# Patient Record
Sex: Male | Born: 1955
Health system: Southern US, Community
[De-identification: ages and names within clinical notes are randomized; demographics above are authoritative.]

## PROBLEM LIST (undated history)

## (undated) DIAGNOSIS — G4730 Sleep apnea, unspecified: Secondary | ICD-10-CM

## (undated) DIAGNOSIS — I10 Essential (primary) hypertension: Secondary | ICD-10-CM

## (undated) DIAGNOSIS — H269 Unspecified cataract: Secondary | ICD-10-CM

## (undated) DIAGNOSIS — E785 Hyperlipidemia, unspecified: Secondary | ICD-10-CM

## (undated) HISTORY — DX: Sleep apnea, unspecified: G47.30

## (undated) HISTORY — DX: Hyperlipidemia, unspecified: E78.5

## (undated) HISTORY — PX: BACK SURGERY: SHX140

## (undated) HISTORY — DX: Unspecified cataract: H26.9

---

## 1986-08-09 HISTORY — PX: BACK SURGERY: SHX140

## 2009-01-26 ENCOUNTER — Ambulatory Visit: Payer: Self-pay | Admitting: Diagnostic Radiology

## 2009-01-26 ENCOUNTER — Emergency Department (HOSPITAL_BASED_OUTPATIENT_CLINIC_OR_DEPARTMENT_OTHER): Admission: EM | Admit: 2009-01-26 | Discharge: 2009-01-26 | Payer: Self-pay | Admitting: Emergency Medicine

## 2011-09-29 ENCOUNTER — Emergency Department (INDEPENDENT_AMBULATORY_CARE_PROVIDER_SITE_OTHER): Payer: BC Managed Care – PPO

## 2011-09-29 ENCOUNTER — Encounter (HOSPITAL_BASED_OUTPATIENT_CLINIC_OR_DEPARTMENT_OTHER): Payer: Self-pay | Admitting: *Deleted

## 2011-09-29 ENCOUNTER — Emergency Department (HOSPITAL_BASED_OUTPATIENT_CLINIC_OR_DEPARTMENT_OTHER)
Admission: EM | Admit: 2011-09-29 | Discharge: 2011-09-29 | Disposition: A | Discharge status: Home / Self Care | Payer: BC Managed Care – PPO | Attending: Emergency Medicine | Admitting: Emergency Medicine

## 2011-09-29 DIAGNOSIS — I10 Essential (primary) hypertension: Secondary | ICD-10-CM | POA: Insufficient documentation

## 2011-09-29 DIAGNOSIS — R109 Unspecified abdominal pain: Secondary | ICD-10-CM | POA: Insufficient documentation

## 2011-09-29 DIAGNOSIS — S92919A Unspecified fracture of unspecified toe(s), initial encounter for closed fracture: Secondary | ICD-10-CM

## 2011-09-29 DIAGNOSIS — M533 Sacrococcygeal disorders, not elsewhere classified: Secondary | ICD-10-CM

## 2011-09-29 DIAGNOSIS — W19XXXA Unspecified fall, initial encounter: Secondary | ICD-10-CM

## 2011-09-29 DIAGNOSIS — E119 Type 2 diabetes mellitus without complications: Secondary | ICD-10-CM | POA: Insufficient documentation

## 2011-09-29 DIAGNOSIS — M25579 Pain in unspecified ankle and joints of unspecified foot: Secondary | ICD-10-CM | POA: Insufficient documentation

## 2011-09-29 DIAGNOSIS — M79609 Pain in unspecified limb: Secondary | ICD-10-CM | POA: Insufficient documentation

## 2011-09-29 DIAGNOSIS — Z79899 Other long term (current) drug therapy: Secondary | ICD-10-CM | POA: Insufficient documentation

## 2011-09-29 DIAGNOSIS — S92902A Unspecified fracture of left foot, initial encounter for closed fracture: Secondary | ICD-10-CM

## 2011-09-29 DIAGNOSIS — IMO0002 Reserved for concepts with insufficient information to code with codable children: Secondary | ICD-10-CM | POA: Insufficient documentation

## 2011-09-29 DIAGNOSIS — W108XXA Fall (on) (from) other stairs and steps, initial encounter: Secondary | ICD-10-CM | POA: Insufficient documentation

## 2011-09-29 HISTORY — DX: Essential (primary) hypertension: I10

## 2011-09-29 MED ORDER — BACITRACIN 500 UNIT/GM EX OINT
1.0000 "application " | TOPICAL_OINTMENT | Freq: Two times a day (BID) | CUTANEOUS | Status: DC
  Filled 2011-09-29: qty 0.9

## 2011-09-29 MED ORDER — OXYCODONE-ACETAMINOPHEN 5-325 MG PO TABS: 1.0000 | ORAL_TABLET | Freq: Four times a day (QID) | ORAL | Status: AC | PRN

## 2011-09-29 MED ORDER — TETANUS-DIPHTH-ACELL PERTUSSIS 5-2.5-18.5 LF-MCG/0.5 IM SUSP
0.5000 mL | Freq: Once | INTRAMUSCULAR | Status: AC
  Administered 2011-09-29: 0.5 mL via INTRAMUSCULAR
  Filled 2011-09-29: qty 0.5

## 2011-09-29 NOTE — Discharge Instructions (Signed)
Foot Fracture     A fractured foot is a broken bone in your foot. These fractures are usually caused by twisting or crush injuries. Some foot fractures are stress fractures which are due to excess walking or exercise. If the bones are in a good position, foot fractures will usually heal in about 6 weeks. You should keep your foot elevated for the next 2 to 4 days and apply ice packs to the area of the injury for 20 to 30 minutes every 2 to 3 hours until the swelling and pain get better.  If you have been placed in a cast or splint, keep it on until you have been checked by your caregiver. Do not walk on a broken foot until bearing weight is relatively painless. Often a cast or podiatric shoe with a stiff sole is used to allow early walking. Repeat X-rays are often needed in 3 to 6 weeks to make sure the fracture is healing.  Follow up with your caregiver as recommended.   SEEK IMMEDIATE MEDICAL CARE IF:  · You have increased pain, or your toes become cold, numb, or pale.   MAKE SURE YOU:   · Understand these instructions.   · Will watch your condition.   · Will get help right away if you are not doing well or get worse.   Document Released: 09/02/2004 Document Revised: 04/07/2011 Document Reviewed: 08/28/2008  [employer]® Patient Information ©2012 [employer].

## 2011-09-29 NOTE — ED Provider Notes (Signed)
History     CSN: [id number]  Arrival date & time 09/29/11  0046   First MD Initiated Contact with Patient 09/29/11 0114      Chief Complaint  Patient presents with  . Foot Pain    (Consider location/radiation/quality/duration/timing/severity/associated sxs/prior treatment) Patient is a 56 y.o. male presenting with lower extremity pain and fall.  Foot Pain This is a new problem. The current episode started 1 to 2 hours ago. The problem occurs constantly. The problem has not changed since onset.Pertinent negatives include no chest pain, no abdominal pain, no headaches and no shortness of breath. The symptoms are aggravated by walking. The symptoms are relieved by narcotics. Treatments tried: narcotics. The treatment provided mild relief.  Fall The accident occurred 1 to 2 hours ago. The fall occurred while walking. Distance fallen: down several steps. He landed on a hard floor. The volume of blood lost was minimal. Point of impact: tail bone and left foot. Pain location: foot and tailbone. The pain is at a severity of 9/10. The pain is severe. He was ambulatory at the scene. There was no entrapment after the fall. There was no alcohol use involved in the accident. Pertinent negatives include no abdominal pain, no headaches, no hearing loss, no loss of consciousness and no tingling. The symptoms are aggravated by activity. Prehospitalization: none. Treatments tried: narcotic pain medication. The treatment provided mild relief.    Past Medical History  Diagnosis Date  . Diabetes mellitus   . Hypertension     Past Surgical History  Procedure Date  . Back surgery     No family history on file.  History  Substance Use Topics  . Smoking status: Never Smoker   . Smokeless tobacco: Not on file  . Alcohol Use:       Review of Systems  Constitutional: Negative.   HENT: Negative.   Eyes: Negative.   Respiratory: Negative for shortness of breath.   Cardiovascular: Negative for  chest pain.  Gastrointestinal: Negative.  Negative for abdominal pain.  Genitourinary: Negative.   Musculoskeletal: Negative.   Skin: Positive for wound.  Neurological: Negative for tingling, loss of consciousness and headaches.  Hematological: Negative.   Psychiatric/Behavioral: Negative.     Allergies  Review of patient's allergies indicates no known allergies.  Home Medications   Current Outpatient Rx  Name Route Sig Dispense Refill  . GLIMEPIRIDE 1 MG PO TABS Oral Take 1 mg by mouth daily before breakfast.    . LISINOPRIL 10 MG PO TABS Oral Take 10 mg by mouth daily.    Marland Kitchen METFORMIN HCL 1000 MG PO TABS Oral Take 1,000 mg by mouth 2 (two) times daily with a meal.      BP 138/65  Pulse 79  Temp(Src) 97.6 F (36.4 C) (Oral)  Resp 20  SpO2 96%  Physical Exam  Constitutional: He is oriented to person, place, and time. He appears well-developed and well-nourished.  HENT:  Head: Normocephalic and atraumatic.  Right Ear: No hemotympanum.  Left Ear: No hemotympanum.  Mouth/Throat: Oropharynx is clear and moist.  Eyes: EOM are normal. Pupils are equal, round, and reactive to light.  Neck: Normal range of motion. Neck supple.  Cardiovascular: Normal rate and regular rhythm.   Pulmonary/Chest: Effort normal and breath sounds normal.  Abdominal: Soft. Bowel sounds are normal. There is no tenderness. There is no rebound and no guarding.  Musculoskeletal: Normal range of motion.       Left foot: He exhibits tenderness.  Feet:       Intact achilles, neurovascularly intact left foot.  2+ dorsalis pedis  Sensation intact motor intact.    Neurological: He is alert and oriented to person, place, and time.  Skin: Skin is warm and dry.  Psychiatric: He has a normal mood and affect.    ED Course  Procedures (including critical care time)  Labs Reviewed - No data to display Dg Pelvis 1-2 Views  09/29/2011  *RADIOLOGY REPORT*  Clinical Data: Status post fall; pelvic pain,  radiating to the coccyx.  PELVIS - 1-2 VIEW  Comparison: None.  Findings: There is no evidence of fracture or dislocation.  Both femoral heads are seated normally within their respective acetabula.  No significant degenerative change is appreciated.  The sacroiliac joints are unremarkable in appearance.  The visualized bowel gas pattern is grossly unremarkable in appearance.  IMPRESSION: No evidence of fracture or dislocation.  Original Report Authenticated By: Tonia Ghent, M.D.   Dg Ankle Complete Left  09/29/2011  *RADIOLOGY REPORT*  Clinical Data: Status post fall; left foot pain.  LEFT ANKLE COMPLETE - 3+ VIEW  Comparison: None.  Findings: There is no evidence of fracture or dislocation.  The ankle mortise is intact; the interosseous space is within normal limits.  No talar tilt or subluxation is seen.  Plantar and posterior calcaneal spurs are incidentally noted.  The joint spaces are preserved.  No significant soft tissue abnormalities are seen.  IMPRESSION: No evidence of fracture or dislocation; known fractures of the fourth and fifth proximal phalanges are not seen on ankle radiograph.  Original Report Authenticated By: Tonia Ghent, M.D.   Dg Foot Complete Left  09/29/2011  *RADIOLOGY REPORT*  Clinical Data: Status post fall; left foot pain, at the fifth metatarsal, radiating to the lateral malleolus.  LEFT FOOT - COMPLETE 3+ VIEW  Comparison: None.  Findings: There is a mildly displaced mildly comminuted fracture involving the base of the fifth proximal phalanx, with intra- articular extension both medially and laterally. There also appears to be a mildly displaced fracture involving the base of the fourth proximal phalanx, without definite evidence of intra-articular extension.  The joint spaces are otherwise preserved.  There is no evidence of talar subluxation; the subtalar joint is unremarkable in appearance.  A posterior calcaneal spur is incidentally noted.  Soft tissue swelling is noted  along the lateral aspect of the foot.  IMPRESSION:  1.  Mildly displaced mildly comminuted fracture involving the base of the fifth proximal phalanx, with intra-articular extension. 2.  Apparent mildly displaced fracture involving the base of the fourth proximal phalanx.  Original Report Authenticated By: Tonia Ghent, M.D.     No diagnosis found.    MDM  Case d/w Dr. Despina Hick, call to be seen today        Jaylissa Felty K Savreen Gebhardt-Rasch, MD 09/29/11 [phone]

## 2011-09-29 NOTE — ED Notes (Addendum)
Patient fell down the stairs appx an hour ago. His left foot got caught in the rails and he landed onto his tailbone.  Patient took vicodin and phenergan at home before coming to the ER

## 2012-01-10 ENCOUNTER — Encounter (HOSPITAL_BASED_OUTPATIENT_CLINIC_OR_DEPARTMENT_OTHER): Payer: Self-pay | Admitting: Family Medicine

## 2012-01-10 ENCOUNTER — Emergency Department (HOSPITAL_BASED_OUTPATIENT_CLINIC_OR_DEPARTMENT_OTHER)
Admission: EM | Admit: 2012-01-10 | Discharge: 2012-01-10 | Disposition: A | Discharge status: Home / Self Care | Payer: BC Managed Care – PPO | Attending: Emergency Medicine | Admitting: Emergency Medicine

## 2012-01-10 DIAGNOSIS — K648 Other hemorrhoids: Secondary | ICD-10-CM | POA: Insufficient documentation

## 2012-01-10 DIAGNOSIS — K649 Unspecified hemorrhoids: Secondary | ICD-10-CM

## 2012-01-10 DIAGNOSIS — E119 Type 2 diabetes mellitus without complications: Secondary | ICD-10-CM | POA: Insufficient documentation

## 2012-01-10 DIAGNOSIS — K644 Residual hemorrhoidal skin tags: Secondary | ICD-10-CM | POA: Insufficient documentation

## 2012-01-10 DIAGNOSIS — K921 Melena: Secondary | ICD-10-CM | POA: Insufficient documentation

## 2012-01-10 DIAGNOSIS — I10 Essential (primary) hypertension: Secondary | ICD-10-CM | POA: Insufficient documentation

## 2012-01-10 DIAGNOSIS — R197 Diarrhea, unspecified: Secondary | ICD-10-CM

## 2012-01-10 DIAGNOSIS — R739 Hyperglycemia, unspecified: Secondary | ICD-10-CM

## 2012-01-10 LAB — BASIC METABOLIC PANEL
BUN: 14 mg/dL (ref 6–23)
Chloride: 98 mEq/L (ref 96–112)
GFR calc Af Amer: >90 mL/min (ref >90)
GFR calc non Af Amer: >90 mL/min (ref >90)
Potassium: 3.8 mEq/L (ref 3.5–5.1)
Sodium: 135 mEq/L (ref 135–145)

## 2012-01-10 LAB — HEMOGLOBIN AND HEMATOCRIT, BLOOD: Hemoglobin: 15.1 g/dL (ref 13.0–17.0)

## 2012-01-10 LAB — OCCULT BLOOD X 1 CARD TO LAB, STOOL: Fecal Occult Bld: POSITIVE

## 2012-01-10 NOTE — ED Provider Notes (Signed)
History     CSN: [id number]  Arrival date & time 01/10/12  1103   First MD Initiated Contact with Patient 01/10/12 1144      Chief Complaint  Patient presents with  . Diarrhea    (Consider location/radiation/quality/duration/timing/severity/associated sxs/prior treatment) HPI  23yoM h/o DM, HTN presents with diarrhea and rectal bleeding. Patient states that he began to have diarrhea 3 days ago. He reports approximately 3 loose stools per day. He states that the diarrhea is getting better but this morning he noticed mucousy bloody discharge surrounding his stool which was more well formed today. He noticed no blood in the toilet or when he wiped. He describes it as a minimal amount. He denies abdominal pain, nausea, vomiting. He denies rectal pain. No history of similar. No known history of hemorrhoids. He states that he has never had a colonoscopy. No history of anticoagulant use. No recent abx use. Denies recent travel.   Past Medical History  Diagnosis Date  . Diabetes mellitus   . Hypertension     Past Surgical History  Procedure Date  . Back surgery     No family history on file.  History  Substance Use Topics  . Smoking status: Never Smoker   . Smokeless tobacco: Not on file  . Alcohol Use: No      Review of Systems  All other systems reviewed and are negative.   except as noted HPI   Allergies  Review of patient's allergies indicates no known allergies.  Home Medications   Current Outpatient Rx  Name Route Sig Dispense Refill  . VITAMIN D 1000 UNITS PO TABS Oral Take by mouth daily.    Marland Kitchen GLIMEPIRIDE 1 MG PO TABS Oral Take 1 mg by mouth daily before breakfast.    . LISINOPRIL 10 MG PO TABS Oral Take 10 mg by mouth daily.    Marland Kitchen METFORMIN HCL 1000 MG PO TABS Oral Take 1,000 mg by mouth 2 (two) times daily with a meal.      BP 150/92  Pulse 89  Temp(Src) 98.6 F (37 C) (Oral)  Resp 20  Ht 6' (1.829 m)  Wt 308 lb (139.708 kg)  BMI 41.77 kg/m2  SpO2  96%  Physical Exam  Nursing note and vitals reviewed. Constitutional: He is oriented to person, place, and time. He appears well-developed and well-nourished. No distress.  HENT:  Head: Atraumatic.  Mouth/Throat: Oropharynx is clear and moist.  Eyes: Conjunctivae are normal. Pupils are equal, round, and reactive to light.  Neck: Neck supple.  Cardiovascular: Normal rate, regular rhythm, normal heart sounds and intact distal pulses.  Exam reveals no gallop and no friction rub.   No murmur heard. Pulmonary/Chest: Effort normal. No respiratory distress. He has no wheezes. He has no rales.  Abdominal: Soft. Bowel sounds are normal. There is no tenderness. There is no rebound and no guarding.  Genitourinary:       +int/ext hemorrhoids Brown mucous stool with red flecks of blood Heme POS No rectal pain with digital exam  Musculoskeletal: Normal range of motion. He exhibits no edema and no tenderness.  Neurological: He is alert and oriented to person, place, and time.  Skin: Skin is warm and dry.  Psychiatric: He has a normal mood and affect.    ED Course  Procedures (including critical care time)  Labs Reviewed  BASIC METABOLIC PANEL - Abnormal; Notable for the following:    Glucose, Bld 220 (*)    All other components within normal  limits  HEMOGLOBIN AND HEMATOCRIT, BLOOD  OCCULT BLOOD X 1 CARD TO LAB, STOOL   No results found.   1. Diarrhea   2. Blood in stool   3. Hemorrhoid   4. Hypertension   5. Hyperglycemia     MDM  Well appearing, presents with resolving diarrhea and BRB surrounding BM today. He is not having pain. He is afebrile without recent travel. No recent antibiotic use. He has brown mucousy stools with flecks of red and hemorrhoids present on exam. I do not suspect significant GI bleed at this time. His hemoglobin is unremarkable. He's been given precautions for return to the emergency department including rectal pain, continued bright red blood per, abdominal  pain. He's been advised to obtain an appointment with his primary care Dr. for several reasons including one colonoscopy referral to recheck of his blood pressure which was elevated today 3 followup for long-term care of his diabetes. He states that he has been compliant with his medications he has refills for both his antihypertensives and diabetic medications at home. Patient feels comfortable with discharge. No EMC precluding discharge at this time. Given Precautions for return. PMD f/u.        Forbes Cellar, MD 01/10/12 [phone]

## 2012-01-10 NOTE — Discharge Instructions (Signed)
Bloody Stools Bloody stools often mean that there is a problem in the digestive tract. Your caregiver may use the term "melena" to describe black, tarry, and bad smelling stools or "hematochezia" to describe red or maroon-colored stools. Blood seen in the stool can be caused by bleeding anywhere along the intestinal tract.  A black stool usually means that blood is coming from the upper part of the gastrointestinal tract (esophagus, stomach, or small bowel). Passing maroon-colored stools or bright red blood usually means that blood is coming from lower down in the large bowel or the rectum. However, sometimes massive bleeding in the stomach or small intestine can cause bright red bloody stools.  Consuming black licorice, lead, iron pills, medicines containing bismuth subsalicylate, or blueberries can also cause black stools. Your caregiver can test black stools to see if blood is present. It is important that the cause of the bleeding be found. Treatment can then be started, and the problem can be corrected. Rectal bleeding may not be serious, but you should not assume everything is okay until you know the cause.It is very important to follow up with your caregiver or a specialist in gastrointestinal problems. CAUSES  Blood in the stools can come from various underlying causes.Often, the cause is not found during your first visit. Testing is often needed to discover the cause of bleeding in the gastrointestinal tract. Causes range from simple to serious or even life-threatening.Possible causes include:  Hemorrhoids.These are veins that are full of blood (engorged) in the rectum. They cause pain, inflammation, and may bleed.   Anal fissures.These are areas of painful tearing which may bleed. They are often caused by passing hard stool.   Diverticulosis.These are pouches that form on the colon over time, with age, and may bleed significantly.   Diverticulitis.This is inflammation in areas with  diverticulosis. It can cause pain, fever, and bloody stools, although bleeding is rare.   Proctitis and colitis. These are inflamed areas of the rectum or colon. They may cause pain, fever, and bloody stools.   Polyps and cancer. Colon cancer is a leading cause of preventable cancer death.It often starts out as precancerous polyps that can be removed during a colonoscopy, preventing progression into cancer. Sometimes, polyps and cancer may cause rectal bleeding.   Gastritis and ulcers.Bleeding from the upper gastrointestinal tract (near the stomach) may travel through the intestines and produce black, sometimes tarry, often bad smelling stools. In certain cases, if the bleeding is fast enough, the stools may not be black, but red and the condition may be life-threatening.  SYMPTOMS  You may have stools that are bright red and bloody, that are normal color with blood on them, or that are dark black and tarry. In some cases, you may only have blood in the toilet bowl. Any of these cases need medical care. You may also have:  Pain at the anus or anywhere in the rectum.   Lightheadedness or feeling faint.   Extreme weakness.   Nausea or vomiting.   Fever.  DIAGNOSIS Your caregiver may use the following methods to find the cause of your bleeding:  Taking a medical history. Age is important. Older people tend to develop polyps and cancer more often. If there is anal pain and a hard, large stool associated with bleeding, a tear of the anus may be the cause. If blood drips into the toilet after a bowel movement, bleeding hemorrhoids may be the problem. The color and frequency of the bleeding are additional considerations.   In most cases, the medical history provides clues, but seldom the final answer.   A visual and finger (digital) exam. Your caregiver will inspect the anal area, looking for tears and hemorrhoids. A finger exam can provide information when there is tenderness or a growth inside. In  men, the prostate is also examined.   Endoscopy. Several types of small, long scopes (endoscopes) are used to view the colon.   In the office, your caregiver may use a rigid, or more commonly, a flexible viewing sigmoidoscope. This exam is called flexible sigmoidoscopy. It is performed in 5 to 10 minutes.   A more thorough exam is accomplished with a colonoscope. It allows your caregiver to view the entire 5 to 6 foot long colon. Medicine to help you relax (sedative) is usually given for this exam. Frequently, a bleeding lesion may be present beyond the reach of the sigmoidoscope. So, a colonoscopy may be the best exam to start with. Both exams are usually done on an outpatient basis. This means the patient does not stay overnight in the hospital or surgery center.   An upper endoscopy may be needed to examine your stomach. Sedation is used and a flexible endoscope is put in your mouth, down to your stomach.   A barium enema X-ray. This is an X-ray exam. It uses liquid barium inserted by enema into the rectum. This test alone may not identify an actual bleeding point. X-rays highlight abnormal shadows, such as those made by lumps (tumors), diverticuli, or colitis.  TREATMENT  Treatment depends on the cause of your bleeding.   For bleeding from the stomach or colon, the caregiver doing your endoscopy or colonoscopy may be able to stop the bleeding as part of the procedure.   Inflammation or infection of the colon can be treated with medicines.   Many rectal problems can be treated with creams, suppositories, or warm baths.   Surgery is sometimes needed.   Blood transfusions are sometimes needed if you have lost a lot of blood.   For any bleeding problem, let your caregiver know if you take aspirin or other blood thinners regularly.  HOME CARE INSTRUCTIONS   Take any medicines exactly as prescribed.   Keep your stools soft by eating a diet high in fiber. Prunes (1 to 3 a day) work well for  many people.   Drink enough water and fluids to keep your urine clear or pale yellow.   Take sitz baths if advised. A sitz bath is when you sit in a bathtub with warm water for 10 to 15 minutes to soak, soothe, and cleanse the rectal area.   If enemas or suppositories are advised, be sure you know how to use them. Tell your caregiver if you have problems with this.   Monitor your bowel movements to look for signs of improvement or worsening.  SEEK MEDICAL CARE IF:   You do not improve in the time expected.   Your condition worsens after initial improvement.   You develop any new symptoms.  SEEK IMMEDIATE MEDICAL CARE IF:   You develop severe or prolonged rectal bleeding.   You vomit blood.   You feel weak or faint.   You have a fever.  MAKE SURE YOU:  Understand these instructions.   Will watch your condition.   Will get help right away if you are not doing well or get worse.  Document Released: 07/16/2002 Document Revised: 07/15/2011 Document Reviewed: 12/11/2010 [facility] Patient Information 2012 [place], [place]. RESOURCE  GUIDE  Dental Problems  Patients with Medicaid: [facility] [id number]                                           [phone] [employer] [address] Phone:  [phone]                                                   Phone:  [phone]  If unable to pay or uninsured, contact:  [facility] or [facility]. to become qualified for the adult dental clinic.  Chronic Pain Problems Contact [facility]  [phone] Patients need to be referred by their primary care doctor.  Insufficient Money for Medicine Contact [employer]:  call "211" or [facility] [phone].  No Primary Care Doctor Call [facility]  [phone] Other agencies that provide inexpensive medical care    [facility]  [phone]    [facility]  [phone]    [facility]  [phone]    [facility]  [phone]    [facility]  [phone]    [facility]  [phone]  Psychological Services [facility]  [phone] [facility]  [phone] [facility]   [phone] (emergency services [phone])  Abuse/Neglect [facility] Child Abuse Hotline 205-378-2792 [facility] Child Abuse Hotline (717)452-5181 (After Hours)  Emergency Shelter [facility] [phone]  M[facility] [place] [facility] 639-093-5151 [facility] [phone]  MRSA Hotline #:   551-835-2526    [facility] Resources  [facility]  [employer]                           [facility] Dept. [address] [place]                     [address]         [address]  Blondell Reveal Phone:  [phone]                                  Phone:  [phone]                   Phone:  [phone]  [facility] Phone:  [phone]  [facility] Child Abuse Hotline [phone] 4344416078 (After Hours)

## 2012-01-10 NOTE — ED Notes (Signed)
Pt c/o diarrhea x 3 days and "noticed bright red blood streaks in toilet this morning". Pt denies fever, n/v.

## 2013-09-15 ENCOUNTER — Emergency Department (HOSPITAL_BASED_OUTPATIENT_CLINIC_OR_DEPARTMENT_OTHER): Payer: BC Managed Care – PPO

## 2013-09-15 ENCOUNTER — Emergency Department (HOSPITAL_BASED_OUTPATIENT_CLINIC_OR_DEPARTMENT_OTHER)
Admission: EM | Admit: 2013-09-15 | Discharge: 2013-09-15 | Disposition: A | Discharge status: Home / Self Care | Payer: BC Managed Care – PPO | Attending: Emergency Medicine | Admitting: Emergency Medicine

## 2013-09-15 ENCOUNTER — Encounter (HOSPITAL_BASED_OUTPATIENT_CLINIC_OR_DEPARTMENT_OTHER): Payer: Self-pay | Admitting: Emergency Medicine

## 2013-09-15 DIAGNOSIS — Z79899 Other long term (current) drug therapy: Secondary | ICD-10-CM | POA: Insufficient documentation

## 2013-09-15 DIAGNOSIS — E119 Type 2 diabetes mellitus without complications: Secondary | ICD-10-CM | POA: Insufficient documentation

## 2013-09-15 DIAGNOSIS — I10 Essential (primary) hypertension: Secondary | ICD-10-CM | POA: Insufficient documentation

## 2013-09-15 DIAGNOSIS — Z9889 Other specified postprocedural states: Secondary | ICD-10-CM | POA: Insufficient documentation

## 2013-09-15 DIAGNOSIS — M25511 Pain in right shoulder: Secondary | ICD-10-CM

## 2013-09-15 DIAGNOSIS — M25519 Pain in unspecified shoulder: Secondary | ICD-10-CM | POA: Insufficient documentation

## 2013-09-15 MED ORDER — KETOROLAC TROMETHAMINE 60 MG/2ML IM SOLN
60.0000 mg | Freq: Once | INTRAMUSCULAR | Status: AC
  Administered 2013-09-15: 60 mg via INTRAMUSCULAR
  Filled 2013-09-15: qty 2

## 2013-09-15 MED ORDER — OXYCODONE-ACETAMINOPHEN 5-325 MG PO TABS
2.0000 | ORAL_TABLET | Freq: Once | ORAL | Status: AC
  Administered 2013-09-15: 2 via ORAL
  Filled 2013-09-15: qty 2

## 2013-09-15 MED ORDER — CYCLOBENZAPRINE HCL 10 MG PO TABS
10.0000 mg | ORAL_TABLET | Freq: Once | ORAL | Status: AC
  Administered 2013-09-15: 10 mg via ORAL
  Filled 2013-09-15: qty 1

## 2013-09-15 MED ORDER — CYCLOBENZAPRINE HCL 10 MG PO TABS: 10.0000 mg | ORAL_TABLET | Freq: Two times a day (BID) | ORAL | Status: DC | PRN

## 2013-09-15 MED ORDER — OXYCODONE-ACETAMINOPHEN 5-325 MG PO TABS: 2.0000 | ORAL_TABLET | ORAL | Status: DC | PRN

## 2013-09-15 NOTE — ED Provider Notes (Signed)
CSN: [id number]     Arrival date & time 09/15/13  1426 History   First MD Initiated Contact with Patient 09/15/13 1449     Chief Complaint  Patient presents with  . Shoulder Pain   (Consider location/radiation/quality/duration/timing/severity/associated sxs/prior Treatment) HPI Comments: Patient is a 58 year old male who presents with right shoulder pain that started 1 week ago. Symptom started gradually and progressively worsened since the onset. The pain is aching and severe with radiation down his right arm. Patient denies any injury but does reports recently starting and exercise regimen. He reports doing bicep curls and thinks that may have something to do with the pain. Position of the shoulder does not affect the pain. No aggravating/alleviating factors. No other symptoms.   Patient is a 58 y.o. male presenting with shoulder pain.  Shoulder Pain Associated symptoms include arthralgias. Pertinent negatives include no abdominal pain, chest pain, chills, fatigue, fever, nausea, neck pain, vomiting or weakness.    Past Medical History  Diagnosis Date  . Diabetes mellitus   . Hypertension    Past Surgical History  Procedure Laterality Date  . Back surgery     No family history on file. History  Substance Use Topics  . Smoking status: Never Smoker   . Smokeless tobacco: Not on file  . Alcohol Use: No    Review of Systems  Constitutional: Negative for fever, chills and fatigue.  HENT: Negative for trouble swallowing.   Eyes: Negative for visual disturbance.  Respiratory: Negative for shortness of breath.   Cardiovascular: Negative for chest pain and palpitations.  Gastrointestinal: Negative for nausea, vomiting, abdominal pain and diarrhea.  Genitourinary: Negative for dysuria and difficulty urinating.  Musculoskeletal: Positive for arthralgias. Negative for neck pain.  Skin: Negative for color change.  Neurological: Negative for dizziness and weakness.   Psychiatric/Behavioral: Negative for dysphoric mood.    Allergies  Review of patient's allergies indicates no known allergies.  Home Medications   Current Outpatient Rx  Name  Route  Sig  Dispense  Refill  . cholecalciferol (VITAMIN D) 1000 UNITS tablet   Oral   Take by mouth daily.         Marland Kitchen. glimepiride (AMARYL) 1 MG tablet   Oral   Take 1 mg by mouth daily before breakfast.         . lisinopril (PRINIVIL,ZESTRIL) 10 MG tablet   Oral   Take 10 mg by mouth daily.         . metFORMIN (GLUCOPHAGE) 1000 MG tablet   Oral   Take 1,000 mg by mouth 2 (two) times daily with a meal.          BP 128/75  Pulse 81  Temp(Src) 98.5 F (36.9 C)  Ht 6' (1.829 m)  Wt 290 lb (131.543 kg)  BMI 39.32 kg/m2  SpO2 97% Physical Exam  Nursing note and vitals reviewed. Constitutional: He is oriented to person, place, and time. He appears well-developed and well-nourished. No distress.  HENT:  Head: Normocephalic and atraumatic.  Eyes: Conjunctivae and EOM are normal. Pupils are equal, round, and reactive to light.  Neck: Normal range of motion.  Cardiovascular: Normal rate and regular rhythm.  Exam reveals no gallop and no friction rub.   No murmur heard. Pulmonary/Chest: Effort normal and breath sounds normal. He has no wheezes. He has no rales. He exhibits no tenderness.  Abdominal: Soft.  Musculoskeletal: Normal range of motion.  Full ROM of right shoulder. No tenderness to palpation or obvious  deformity.   Neurological: He is alert and oriented to person, place, and time. Coordination normal.  Upper extremity strength and sensation equal and intact bilaterally. Speech is goal-oriented. Moves limbs without ataxia.   Skin: Skin is warm and dry.  Psychiatric: He has a normal mood and affect. His behavior is normal.    ED Course  Procedures (including critical care time) Labs Review Labs Reviewed - No data to display Imaging Review Dg Shoulder Right  09/15/2013   CLINICAL  DATA:  Right shoulder pain and swelling.  EXAM: RIGHT SHOULDER - 2+ VIEW  COMPARISON:  None.  FINDINGS: The right shoulder is located. The visualized right ribs are intact. Negative for an acute fracture. Sclerotic density in the scapular body is nonspecific.  IMPRESSION: No acute bone abnormality to the right shoulder.  Sclerotic density or lesion in the right scapula is nonspecific. Findings could be associated with a bone island.   Electronically Signed   By: Richarda Overlie M.D.   On: 09/15/2013 15:12   Ct Chest Wo Contrast  09/15/2013   CLINICAL DATA:  Right shoulder pain and swelling. Sclerotic density or lesion in the right scapula on right shoulder radiographs earlier today.  EXAM: CT CHEST WITHOUT CONTRAST  TECHNIQUE: Multidetector CT imaging of the chest was performed following the standard protocol without IV contrast.  COMPARISON:  Right shoulder radiographs obtained earlier today.  FINDINGS: Coarse calcifications in the right lobe of the thyroid gland and 8 mm left lobe thyroid nodule.  Coronary artery atheromatous calcifications. Bilateral calcified granulomata. The largest is in the right lower lobe at the level of the right inferior pulmonary vein, measuring 1.2 cm on image number 37. Normal sized calcified right hilar lymph nodes.  No noncalcified lung nodules or enlarged lymph nodes. Again demonstrated is a sclerotic focus in the lateral aspect of the right scapular body. This measures 1.7 x 1.4 cm on image 18. This measures 1.8 cm in length on coronal image number 69. No other sclerotic bone lesions. Thoracic spine degenerative changes.  Partially included oval, heterogeneous low density mass in the mid to upper left kidney, posteriorly, measuring 2.4 x 2.3 cm on image number 64.  IMPRESSION: 1. 1.8 cm rounded sclerotic focus in the scapular body on the right. This has CT features most compatible with a bone island. 2. Partially included low density mass in the upper left kidney, measuring 2.4 cm in  maximum diameter. This does not have the typical noncontrasted appearance of a cyst. This could be initially evaluated with renal ultrasound. This may ultimately require further evaluation with pre and post contrast CT of the abdomen and pelvis. 3. Previous granulomatous infection. 4. Coarse right lobe thyroid calcifications and 8 mm left thyroid nodule, too small to characterize, but most likely benign in the absence of known clinical risk factors for thyroid carcinoma.   Electronically Signed   By: Gordan Payment M.D.   On: 09/15/2013 16:24    EKG Interpretation   None       MDM   1. Right shoulder pain     4:44 PM Patient's shoulder xray shows sclerotic density that is non specific. I spoke with Dr. Lowella Dandy, the radiologist, and he recommended follow up with CT of the chest to visualize the scapula. This imaged showed the lesion to be a bone island. Patient received toradol for pain which provided some relief. Patient will be discharged with Percocet and flexeril for pain. Patient advised to follow up with Ortho. Patient refused  sling due to uncomfortable position.     Emilia Beck, PA-C 09/15/13 1657

## 2013-09-15 NOTE — ED Notes (Signed)
Dc home with rx x 2 given for percocet and flexeril- family present to drive

## 2013-09-15 NOTE — Discharge Instructions (Signed)
Take Percocet as needed for pain. Take Flexeril as needed for muscle spasm. You may take these medications together. Follow up with Dr. Rennis ChrisSupple for further evaluation of your pain. Refer to attached documents for more information.

## 2013-09-15 NOTE — ED Provider Notes (Signed)
Medical screening examination/treatment/procedure(s) were performed by non-physician practitioner and as supervising physician I was immediately available for consultation/collaboration.  EKG Interpretation   None         Junius ArgyleForrest S Curran Lenderman, MD 09/15/13 [phone]

## 2013-09-15 NOTE — ED Notes (Signed)
Used some weights at the gym several weeks ago to do some curls.  Developed right shoulder pain/swelling several days ago.  Painful ROM.  Denies numbness/tingling.  Pain radiates from shoulder into his arm.

## 2013-10-05 ENCOUNTER — Ambulatory Visit: Payer: BC Managed Care – PPO | Attending: Sports Medicine | Admitting: Physical Therapy

## 2013-10-05 DIAGNOSIS — R29898 Other symptoms and signs involving the musculoskeletal system: Secondary | ICD-10-CM | POA: Insufficient documentation

## 2013-10-05 DIAGNOSIS — M5020 Other cervical disc displacement, unspecified cervical region: Secondary | ICD-10-CM | POA: Insufficient documentation

## 2013-10-05 DIAGNOSIS — M436 Torticollis: Secondary | ICD-10-CM | POA: Insufficient documentation

## 2013-10-05 DIAGNOSIS — M542 Cervicalgia: Secondary | ICD-10-CM | POA: Insufficient documentation

## 2013-10-05 DIAGNOSIS — IMO0001 Reserved for inherently not codable concepts without codable children: Secondary | ICD-10-CM | POA: Insufficient documentation

## 2013-10-09 ENCOUNTER — Ambulatory Visit: Payer: BC Managed Care – PPO | Attending: Sports Medicine | Admitting: Physical Therapy

## 2013-10-09 DIAGNOSIS — M542 Cervicalgia: Secondary | ICD-10-CM | POA: Insufficient documentation

## 2013-10-09 DIAGNOSIS — M436 Torticollis: Secondary | ICD-10-CM | POA: Insufficient documentation

## 2013-10-09 DIAGNOSIS — R29898 Other symptoms and signs involving the musculoskeletal system: Secondary | ICD-10-CM | POA: Insufficient documentation

## 2013-10-09 DIAGNOSIS — M5020 Other cervical disc displacement, unspecified cervical region: Secondary | ICD-10-CM | POA: Insufficient documentation

## 2013-10-09 DIAGNOSIS — IMO0001 Reserved for inherently not codable concepts without codable children: Secondary | ICD-10-CM | POA: Insufficient documentation

## 2013-10-11 ENCOUNTER — Ambulatory Visit: Payer: BC Managed Care – PPO | Admitting: Physical Therapy

## 2013-10-16 ENCOUNTER — Ambulatory Visit: Payer: BC Managed Care – PPO | Admitting: Physical Therapy

## 2013-10-18 ENCOUNTER — Ambulatory Visit: Payer: BC Managed Care – PPO | Admitting: Physical Therapy

## 2013-10-23 ENCOUNTER — Ambulatory Visit: Payer: BC Managed Care – PPO | Admitting: Physical Therapy

## 2013-10-25 ENCOUNTER — Ambulatory Visit: Payer: BC Managed Care – PPO | Admitting: Physical Therapy

## 2013-10-30 ENCOUNTER — Ambulatory Visit: Payer: BC Managed Care – PPO | Admitting: Physical Therapy

## 2013-11-01 ENCOUNTER — Ambulatory Visit: Payer: BC Managed Care – PPO | Admitting: Physical Therapy

## 2013-11-07 ENCOUNTER — Ambulatory Visit: Payer: BC Managed Care – PPO | Admitting: Physical Therapy

## 2013-11-20 ENCOUNTER — Ambulatory Visit: Payer: BC Managed Care – PPO | Attending: Sports Medicine | Admitting: Physical Therapy

## 2013-11-20 DIAGNOSIS — M436 Torticollis: Secondary | ICD-10-CM | POA: Insufficient documentation

## 2013-11-20 DIAGNOSIS — M542 Cervicalgia: Secondary | ICD-10-CM | POA: Insufficient documentation

## 2013-11-20 DIAGNOSIS — IMO0001 Reserved for inherently not codable concepts without codable children: Secondary | ICD-10-CM | POA: Insufficient documentation

## 2013-11-20 DIAGNOSIS — R29898 Other symptoms and signs involving the musculoskeletal system: Secondary | ICD-10-CM | POA: Insufficient documentation

## 2013-11-20 DIAGNOSIS — M5020 Other cervical disc displacement, unspecified cervical region: Secondary | ICD-10-CM | POA: Insufficient documentation

## 2014-02-27 ENCOUNTER — Ambulatory Visit: Payer: BC Managed Care – PPO | Attending: Sports Medicine | Admitting: Physical Therapy

## 2014-02-27 DIAGNOSIS — IMO0002 Reserved for concepts with insufficient information to code with codable children: Secondary | ICD-10-CM | POA: Insufficient documentation

## 2014-02-27 DIAGNOSIS — I10 Essential (primary) hypertension: Secondary | ICD-10-CM | POA: Insufficient documentation

## 2014-02-27 DIAGNOSIS — R293 Abnormal posture: Secondary | ICD-10-CM | POA: Insufficient documentation

## 2014-02-27 DIAGNOSIS — IMO0001 Reserved for inherently not codable concepts without codable children: Secondary | ICD-10-CM | POA: Diagnosis present

## 2014-02-27 DIAGNOSIS — M5412 Radiculopathy, cervical region: Secondary | ICD-10-CM | POA: Insufficient documentation

## 2014-02-27 DIAGNOSIS — E119 Type 2 diabetes mellitus without complications: Secondary | ICD-10-CM | POA: Insufficient documentation

## 2014-02-27 DIAGNOSIS — M542 Cervicalgia: Secondary | ICD-10-CM | POA: Insufficient documentation

## 2014-02-27 DIAGNOSIS — M79609 Pain in unspecified limb: Secondary | ICD-10-CM | POA: Diagnosis not present

## 2014-02-27 DIAGNOSIS — G4730 Sleep apnea, unspecified: Secondary | ICD-10-CM | POA: Insufficient documentation

## 2014-03-05 ENCOUNTER — Ambulatory Visit: Payer: BC Managed Care – PPO | Admitting: Physical Therapy

## 2014-03-05 DIAGNOSIS — IMO0001 Reserved for inherently not codable concepts without codable children: Secondary | ICD-10-CM | POA: Diagnosis not present

## 2014-03-13 ENCOUNTER — Ambulatory Visit: Payer: BC Managed Care – PPO | Admitting: Physical Therapy

## 2014-03-19 ENCOUNTER — Ambulatory Visit: Payer: BC Managed Care – PPO | Attending: Sports Medicine | Admitting: Physical Therapy

## 2014-03-19 DIAGNOSIS — R293 Abnormal posture: Secondary | ICD-10-CM | POA: Insufficient documentation

## 2014-03-19 DIAGNOSIS — M5412 Radiculopathy, cervical region: Secondary | ICD-10-CM | POA: Insufficient documentation

## 2014-03-19 DIAGNOSIS — I10 Essential (primary) hypertension: Secondary | ICD-10-CM | POA: Insufficient documentation

## 2014-03-19 DIAGNOSIS — IMO0001 Reserved for inherently not codable concepts without codable children: Secondary | ICD-10-CM | POA: Insufficient documentation

## 2014-03-19 DIAGNOSIS — G4730 Sleep apnea, unspecified: Secondary | ICD-10-CM | POA: Insufficient documentation

## 2014-03-19 DIAGNOSIS — M542 Cervicalgia: Secondary | ICD-10-CM | POA: Insufficient documentation

## 2014-03-19 DIAGNOSIS — IMO0002 Reserved for concepts with insufficient information to code with codable children: Secondary | ICD-10-CM | POA: Insufficient documentation

## 2014-03-19 DIAGNOSIS — E119 Type 2 diabetes mellitus without complications: Secondary | ICD-10-CM | POA: Insufficient documentation

## 2014-03-19 DIAGNOSIS — M79609 Pain in unspecified limb: Secondary | ICD-10-CM | POA: Insufficient documentation

## 2014-03-21 ENCOUNTER — Ambulatory Visit: Payer: BC Managed Care – PPO | Admitting: Physical Therapy

## 2014-03-25 ENCOUNTER — Ambulatory Visit: Payer: BC Managed Care – PPO | Admitting: Physical Therapy

## 2014-03-28 ENCOUNTER — Ambulatory Visit: Payer: BC Managed Care – PPO | Admitting: Physical Therapy

## 2014-04-01 ENCOUNTER — Ambulatory Visit: Payer: BC Managed Care – PPO | Admitting: Physical Therapy

## 2014-04-01 DIAGNOSIS — M79609 Pain in unspecified limb: Secondary | ICD-10-CM | POA: Diagnosis not present

## 2014-04-01 DIAGNOSIS — IMO0001 Reserved for inherently not codable concepts without codable children: Secondary | ICD-10-CM | POA: Diagnosis not present

## 2014-04-01 DIAGNOSIS — I10 Essential (primary) hypertension: Secondary | ICD-10-CM | POA: Diagnosis not present

## 2014-04-01 DIAGNOSIS — M5412 Radiculopathy, cervical region: Secondary | ICD-10-CM | POA: Diagnosis not present

## 2014-04-01 DIAGNOSIS — IMO0002 Reserved for concepts with insufficient information to code with codable children: Secondary | ICD-10-CM | POA: Diagnosis not present

## 2014-04-01 DIAGNOSIS — M542 Cervicalgia: Secondary | ICD-10-CM | POA: Diagnosis not present

## 2014-04-01 DIAGNOSIS — G4730 Sleep apnea, unspecified: Secondary | ICD-10-CM | POA: Diagnosis not present

## 2014-04-01 DIAGNOSIS — E119 Type 2 diabetes mellitus without complications: Secondary | ICD-10-CM | POA: Diagnosis not present

## 2014-04-01 DIAGNOSIS — R293 Abnormal posture: Secondary | ICD-10-CM | POA: Diagnosis not present

## 2014-04-04 ENCOUNTER — Ambulatory Visit: Payer: BC Managed Care – PPO | Admitting: Physical Therapy

## 2014-04-09 ENCOUNTER — Ambulatory Visit: Payer: BC Managed Care – PPO | Admitting: Physical Therapy

## 2018-06-16 ENCOUNTER — Other Ambulatory Visit: Payer: Self-pay | Admitting: Orthopedic Surgery

## 2018-06-16 DIAGNOSIS — M25511 Pain in right shoulder: Secondary | ICD-10-CM

## 2018-06-30 ENCOUNTER — Ambulatory Visit
Admission: RE | Admit: 2018-06-30 | Discharge: 2018-06-30 | Disposition: A | Discharge status: Home / Self Care | Payer: BLUE CROSS/BLUE SHIELD | Source: Ambulatory Visit | Attending: Orthopedic Surgery | Admitting: Orthopedic Surgery

## 2018-06-30 DIAGNOSIS — M25511 Pain in right shoulder: Secondary | ICD-10-CM

## 2018-10-10 DIAGNOSIS — E782 Mixed hyperlipidemia: Secondary | ICD-10-CM | POA: Diagnosis not present

## 2018-10-10 DIAGNOSIS — Z7984 Long term (current) use of oral hypoglycemic drugs: Secondary | ICD-10-CM | POA: Diagnosis not present

## 2018-10-10 DIAGNOSIS — I10 Essential (primary) hypertension: Secondary | ICD-10-CM | POA: Diagnosis not present

## 2018-10-10 DIAGNOSIS — E1165 Type 2 diabetes mellitus with hyperglycemia: Secondary | ICD-10-CM | POA: Diagnosis not present

## 2018-11-23 DIAGNOSIS — M75111 Incomplete rotator cuff tear or rupture of right shoulder, not specified as traumatic: Secondary | ICD-10-CM | POA: Diagnosis not present

## 2018-11-23 DIAGNOSIS — M7501 Adhesive capsulitis of right shoulder: Secondary | ICD-10-CM | POA: Diagnosis not present

## 2018-11-23 DIAGNOSIS — M7531 Calcific tendinitis of right shoulder: Secondary | ICD-10-CM | POA: Diagnosis not present

## 2018-11-23 DIAGNOSIS — M7541 Impingement syndrome of right shoulder: Secondary | ICD-10-CM | POA: Diagnosis not present

## 2019-01-10 ENCOUNTER — Emergency Department (HOSPITAL_BASED_OUTPATIENT_CLINIC_OR_DEPARTMENT_OTHER)
Admission: EM | Admit: 2019-01-10 | Discharge: 2019-01-10 | Disposition: A | Discharge status: Home / Self Care | Payer: BC Managed Care – PPO | Attending: Emergency Medicine | Admitting: Emergency Medicine

## 2019-01-10 ENCOUNTER — Other Ambulatory Visit: Payer: Self-pay

## 2019-01-10 ENCOUNTER — Encounter (HOSPITAL_BASED_OUTPATIENT_CLINIC_OR_DEPARTMENT_OTHER): Payer: Self-pay

## 2019-01-10 DIAGNOSIS — M5431 Sciatica, right side: Secondary | ICD-10-CM | POA: Insufficient documentation

## 2019-01-10 DIAGNOSIS — E119 Type 2 diabetes mellitus without complications: Secondary | ICD-10-CM | POA: Insufficient documentation

## 2019-01-10 DIAGNOSIS — Z79899 Other long term (current) drug therapy: Secondary | ICD-10-CM | POA: Insufficient documentation

## 2019-01-10 DIAGNOSIS — I10 Essential (primary) hypertension: Secondary | ICD-10-CM | POA: Insufficient documentation

## 2019-01-10 DIAGNOSIS — M545 Low back pain: Secondary | ICD-10-CM | POA: Diagnosis not present

## 2019-01-10 DIAGNOSIS — Z7984 Long term (current) use of oral hypoglycemic drugs: Secondary | ICD-10-CM | POA: Insufficient documentation

## 2019-01-10 MED ORDER — OXYCODONE-ACETAMINOPHEN 5-325 MG PO TABS
2.0000 | ORAL_TABLET | Freq: Once | ORAL | Status: AC
  Administered 2019-01-10: 2 via ORAL
  Filled 2019-01-10: qty 2

## 2019-01-10 MED ORDER — PREDNISONE 20 MG PO TABS: 60.0000 mg | ORAL_TABLET | Freq: Every day | ORAL | 0 refills | Status: DC

## 2019-01-10 MED ORDER — KETOROLAC TROMETHAMINE 30 MG/ML IJ SOLN
30.0000 mg | Freq: Once | INTRAMUSCULAR | Status: AC
  Administered 2019-01-10: 30 mg via INTRAMUSCULAR
  Filled 2019-01-10: qty 1

## 2019-01-10 MED ORDER — OXYCODONE-ACETAMINOPHEN 5-325 MG PO TABS: 2.0000 | ORAL_TABLET | Freq: Four times a day (QID) | ORAL | 0 refills | Status: DC | PRN

## 2019-01-10 NOTE — Discharge Instructions (Addendum)
Take prednisone as prescribed until completed.  After you have finished prednisone, I recommend taking ibuprofen as prescribed over-the-counter.  Do not take prednisone and ibuprofen (or other NSAID medication) together.  Take Percocet every 6 hours as needed for severe pain.  Continue taking your Robaxin as prescribed, as needed for muscle pain or spasms.  Do not drive or operate machinery while taking Percocet or Robaxin.  Use ice and heat alternating 20 minutes on, 20 minutes off.  Please follow-up with your primary care provider or orthopedic doctor for further evaluation and treatment of ongoing symptoms.  Please return to the emergency department if you develop any new or worsening symptoms including complete numbness of your legs, groin area, or loss of bowel or bladder control.  Do not drink alcohol, drive, operate machinery or participate in any other potentially dangerous activities while taking opiate pain medication as it may make you sleepy. Do not take this medication with any other sedating medications, either prescription or over-the-counter. If you were prescribed Percocet or Vicodin, do not take these with acetaminophen (Tylenol) as it is already contained within these medications and overdose of Tylenol is dangerous.   This medication is an opiate (or narcotic) pain medication and can be habit forming.  Use it as little as possible to achieve adequate pain control.  Do not use or use it with extreme caution if you have a history of opiate abuse or dependence. This medication is intended for your use only - do not give any to anyone else and keep it in a secure place where nobody else, especially children, have access to it. It will also cause or worsen constipation, so you may want to consider taking an over-the-counter stool softener while you are taking this medication.

## 2019-01-10 NOTE — ED Provider Notes (Signed)
[facility] EMERGENCY DEPARTMENT Provider Note   CSN: [id number] Arrival date & time: 01/10/19  1641    History   Chief Complaint Chief Complaint  Patient presents with  . Back Pain    HPI Michael Howell is a 63 y.o. male with history of hypertension, diabetes who presents with right buttocks pain radiating down behind the leg for the past few days.  It became acutely worse after a walk today.  Patient denies any injury or trauma.  He denies any numbness or tingling, saddle anesthesia, fevers, history of cancer, recent history of procedure to back, history of IVDU.  Patient had surgery on his L5 vertebra 30 years ago.  Patient took Robaxin prior to arrival without relief.     HPI  Past Medical History:  Diagnosis Date  . Diabetes mellitus   . Hypertension     There are no active problems to display for this patient.   Past Surgical History:  Procedure Laterality Date  . BACK SURGERY          Home Medications    Prior to Admission medications   Medication Sig Start Date End Date Taking? Authorizing Provider  cholecalciferol (VITAMIN D) 1000 UNITS tablet Take by mouth daily.    [provider]  cyclobenzaprine (FLEXERIL) 10 MG tablet Take 1 tablet (10 mg total) by mouth 2 (two) times daily as needed for muscle spasms. 09/15/13   Szekalski, Kaitlyn, PA-C  glimepiride (AMARYL) 1 MG tablet Take 1 mg by mouth daily before breakfast.    [provider]  lisinopril (PRINIVIL,ZESTRIL) 10 MG tablet Take 10 mg by mouth daily.    [provider]  metFORMIN (GLUCOPHAGE) 1000 MG tablet Take 1,000 mg by mouth 2 (two) times daily with a meal.    [provider]  oxyCODONE-acetaminophen (PERCOCET/ROXICET) 5-325 MG tablet Take 2 tablets by mouth every 6 (six) hours as needed for severe pain. 01/10/19   Jennifr Gaeta, Waylan BogaAlexandra M, PA-C  predniSONE (DELTASONE) 20 MG tablet Take 3 tablets (60 mg total) by mouth daily. 01/10/19   Emi HolesLaw, Anaka Beazer M, PA-C     Family History No family history on file.  Social History Social History   Tobacco Use  . Smoking status: Never Smoker  . Smokeless tobacco: Never Used  Substance Use Topics  . Alcohol use: No  . Drug use: No     Allergies   Patient has no known allergies.   Review of Systems Review of Systems  Constitutional: Negative for fever.  Musculoskeletal: Positive for back pain.  Neurological: Negative for numbness.     Physical Exam Updated Vital Signs BP 108/81 (BP Location: Left Arm)   Pulse 82   Temp 98.5 F (36.9 C) (Oral)   Resp 14   Ht 6' (1.829 m)   Wt 126.6 kg   SpO2 96%   BMI 37.84 kg/m   Physical Exam Vitals signs and nursing note reviewed.  Constitutional:      General: He is not in acute distress.    Appearance: He is well-developed. He is not diaphoretic.  HENT:     Head: Normocephalic and atraumatic.     Mouth/Throat:     Pharynx: No oropharyngeal exudate.  Eyes:     General: No scleral icterus.       Right eye: No discharge.        Left eye: No discharge.     Conjunctiva/sclera: Conjunctivae normal.     Pupils: Pupils are equal, round, and reactive to  light.  Neck:     Musculoskeletal: Normal range of motion and neck supple.     Thyroid: No thyromegaly.  Cardiovascular:     Rate and Rhythm: Normal rate and regular rhythm.     Heart sounds: Normal heart sounds. No murmur. No friction rub. No gallop.   Pulmonary:     Effort: Pulmonary effort is normal. No respiratory distress.     Breath sounds: Normal breath sounds. No stridor. No wheezing or rales.  Abdominal:     General: Bowel sounds are normal. There is no distension.     Palpations: Abdomen is soft.     Tenderness: There is no abdominal tenderness. There is no guarding or rebound.  Musculoskeletal:       Legs:     Comments: No midline cervical, thoracic, or lumbar tenderness Old scar over the lower lumbar region  Lymphadenopathy:     Cervical: No cervical adenopathy.  Skin:     General: Skin is warm and dry.     Coloration: Skin is not pale.     Findings: No rash.  Neurological:     Mental Status: He is alert.     Coordination: Coordination normal.     Comments: 5/5 strength to bilateral lower extremities with normal sensation      ED Treatments / Results  Labs (all labs ordered are listed, but only abnormal results are displayed) Labs Reviewed - No data to display  EKG None  Radiology No results found.  Procedures Procedures (including critical care time)  Medications Ordered in ED Medications  ketorolac (TORADOL) 30 MG/ML injection 30 mg (30 mg Intramuscular Given 01/10/19 1731)  oxyCODONE-acetaminophen (PERCOCET/ROXICET) 5-325 MG per tablet 2 tablet (2 tablets Oral Given 01/10/19 1730)     Initial Impression / Assessment and Plan / ED Course  I have reviewed the triage vital signs and the nursing notes.  Pertinent labs & imaging results that were available during my care of the patient were reviewed by me and considered in my medical decision making (see chart for details).        Patient presenting with suspected sciatica.  No indication for imaging at this time.  No signs of cauda equina.  No red flag symptoms.  Patient feeling improved after IM Toradol and 2 Percocet in the ED.  Will discharge home with short course of Percocet, 5 days of prednisone, and continue Robaxin.  Patient advised prednisone will elevate blood sugar temporarily.  Ice and heat, as well as stretching discussed.  Follow-up to patient's orthopedic doctor or PCP as needed.  Return precautions discussed.  Patient understands and agrees with plan.  Patient vital stable throughout ED course and discharged in satisfactory condition.  Final Clinical Impressions(s) / ED Diagnoses   Final diagnoses:  Sciatica of right side    ED Discharge Orders         Ordered    oxyCODONE-acetaminophen (PERCOCET/ROXICET) 5-325 MG tablet  Every 6 hours PRN     01/10/19 1835    predniSONE  (DELTASONE) 20 MG tablet  Daily     01/10/19 1835           Emi Holes, PA-C 01/10/19 1836    Vanetta Mulders, MD 01/10/19 1932

## 2019-01-10 NOTE — ED Triage Notes (Signed)
C/o right side lower back,hip LE pain x 2 days-denies injury-NAD-slow limping gait

## 2019-01-16 DIAGNOSIS — M545 Low back pain: Secondary | ICD-10-CM | POA: Diagnosis not present

## 2019-01-31 DIAGNOSIS — M5136 Other intervertebral disc degeneration, lumbar region: Secondary | ICD-10-CM | POA: Diagnosis not present

## 2019-02-22 DIAGNOSIS — M5431 Sciatica, right side: Secondary | ICD-10-CM | POA: Diagnosis not present

## 2019-04-23 DIAGNOSIS — Z794 Long term (current) use of insulin: Secondary | ICD-10-CM | POA: Diagnosis not present

## 2019-04-23 DIAGNOSIS — I10 Essential (primary) hypertension: Secondary | ICD-10-CM | POA: Diagnosis not present

## 2019-04-23 DIAGNOSIS — E119 Type 2 diabetes mellitus without complications: Secondary | ICD-10-CM | POA: Diagnosis not present

## 2019-04-23 DIAGNOSIS — Z7984 Long term (current) use of oral hypoglycemic drugs: Secondary | ICD-10-CM | POA: Diagnosis not present

## 2019-04-23 DIAGNOSIS — E782 Mixed hyperlipidemia: Secondary | ICD-10-CM | POA: Diagnosis not present

## 2019-04-23 DIAGNOSIS — Z87891 Personal history of nicotine dependence: Secondary | ICD-10-CM | POA: Diagnosis not present

## 2019-05-15 DIAGNOSIS — E119 Type 2 diabetes mellitus without complications: Secondary | ICD-10-CM | POA: Diagnosis not present

## 2019-05-15 DIAGNOSIS — H2511 Age-related nuclear cataract, right eye: Secondary | ICD-10-CM | POA: Diagnosis not present

## 2019-05-15 DIAGNOSIS — H00021 Hordeolum internum right upper eyelid: Secondary | ICD-10-CM | POA: Diagnosis not present

## 2019-09-27 DIAGNOSIS — H0012 Chalazion right lower eyelid: Secondary | ICD-10-CM | POA: Diagnosis not present

## 2019-09-27 DIAGNOSIS — E119 Type 2 diabetes mellitus without complications: Secondary | ICD-10-CM | POA: Diagnosis not present

## 2019-10-29 DIAGNOSIS — Z794 Long term (current) use of insulin: Secondary | ICD-10-CM | POA: Diagnosis not present

## 2019-10-29 DIAGNOSIS — E119 Type 2 diabetes mellitus without complications: Secondary | ICD-10-CM | POA: Diagnosis not present

## 2019-10-29 DIAGNOSIS — E782 Mixed hyperlipidemia: Secondary | ICD-10-CM | POA: Diagnosis not present

## 2019-10-29 DIAGNOSIS — I10 Essential (primary) hypertension: Secondary | ICD-10-CM | POA: Diagnosis not present

## 2019-11-22 DIAGNOSIS — M5432 Sciatica, left side: Secondary | ICD-10-CM | POA: Diagnosis not present

## 2019-12-18 DIAGNOSIS — M5432 Sciatica, left side: Secondary | ICD-10-CM | POA: Diagnosis not present

## 2019-12-26 ENCOUNTER — Other Ambulatory Visit: Payer: Self-pay

## 2019-12-26 ENCOUNTER — Emergency Department (HOSPITAL_BASED_OUTPATIENT_CLINIC_OR_DEPARTMENT_OTHER): Payer: BC Managed Care – PPO

## 2019-12-26 ENCOUNTER — Encounter (HOSPITAL_BASED_OUTPATIENT_CLINIC_OR_DEPARTMENT_OTHER): Payer: Self-pay | Admitting: *Deleted

## 2019-12-26 ENCOUNTER — Emergency Department (HOSPITAL_BASED_OUTPATIENT_CLINIC_OR_DEPARTMENT_OTHER)
Admission: EM | Admit: 2019-12-26 | Discharge: 2019-12-27 | Disposition: A | Discharge status: Home / Self Care | Payer: BC Managed Care – PPO | Attending: Emergency Medicine | Admitting: Emergency Medicine

## 2019-12-26 DIAGNOSIS — Z7984 Long term (current) use of oral hypoglycemic drugs: Secondary | ICD-10-CM | POA: Insufficient documentation

## 2019-12-26 DIAGNOSIS — R0602 Shortness of breath: Secondary | ICD-10-CM | POA: Diagnosis not present

## 2019-12-26 DIAGNOSIS — R002 Palpitations: Secondary | ICD-10-CM | POA: Diagnosis not present

## 2019-12-26 DIAGNOSIS — I10 Essential (primary) hypertension: Secondary | ICD-10-CM | POA: Diagnosis not present

## 2019-12-26 DIAGNOSIS — E119 Type 2 diabetes mellitus without complications: Secondary | ICD-10-CM | POA: Diagnosis not present

## 2019-12-26 DIAGNOSIS — Z79899 Other long term (current) drug therapy: Secondary | ICD-10-CM | POA: Insufficient documentation

## 2019-12-26 DIAGNOSIS — E041 Nontoxic single thyroid nodule: Secondary | ICD-10-CM | POA: Insufficient documentation

## 2019-12-26 DIAGNOSIS — R911 Solitary pulmonary nodule: Secondary | ICD-10-CM | POA: Diagnosis not present

## 2019-12-26 LAB — COMPREHENSIVE METABOLIC PANEL
ALT: 40 U/L (ref 0–44)
AST: 33 U/L (ref 15–41)
Albumin: 4.5 g/dL (ref 3.5–5.0)
Alkaline Phosphatase: 49 U/L (ref 38–126)
Anion gap: 14 (ref 5–15)
BUN: 16 mg/dL (ref 8–23)
CO2: 24 mmol/L (ref 22–32)
Calcium: 9.3 mg/dL (ref 8.9–10.3)
Chloride: 96 mmol/L — ABNORMAL LOW (ref 98–111)
Creatinine, Ser: 0.95 mg/dL (ref 0.61–1.24)
GFR calc Af Amer: >60 mL/min (ref >60)
GFR calc non Af Amer: >60 mL/min (ref >60)
Glucose, Bld: 260 mg/dL — ABNORMAL HIGH (ref 70–99)
Potassium: 3.8 mmol/L (ref 3.5–5.1)
Sodium: 134 mmol/L — ABNORMAL LOW (ref 135–145)
Total Bilirubin: 0.9 mg/dL (ref 0.3–1.2)
Total Protein: 7.5 g/dL (ref 6.5–8.1)

## 2019-12-26 LAB — CBC WITH DIFFERENTIAL/PLATELET
Abs Immature Granulocytes: 0.1 10*3/uL — ABNORMAL HIGH (ref 0.00–0.07)
Basophils Absolute: 0.1 10*3/uL (ref 0.0–0.1)
Basophils Relative: 1 %
Eosinophils Absolute: 0.3 10*3/uL (ref 0.0–0.5)
Eosinophils Relative: 2 %
HCT: 48.1 % (ref 39.0–52.0)
Hemoglobin: 16.6 g/dL (ref 13.0–17.0)
Immature Granulocytes: 1 %
Lymphocytes Relative: 17 %
Lymphs Abs: 2.1 10*3/uL (ref 0.7–4.0)
MCH: 30.3 pg (ref 26.0–34.0)
MCHC: 34.5 g/dL (ref 30.0–36.0)
MCV: 87.8 fL (ref 80.0–100.0)
Monocytes Absolute: 0.8 10*3/uL (ref 0.1–1.0)
Monocytes Relative: 7 %
Neutro Abs: 8.9 10*3/uL — ABNORMAL HIGH (ref 1.7–7.7)
Neutrophils Relative %: 72 %
Platelets: 308 10*3/uL (ref 150–400)
RBC: 5.48 MIL/uL (ref 4.22–5.81)
RDW: 13.1 % (ref 11.5–15.5)
WBC: 12.2 10*3/uL — ABNORMAL HIGH (ref 4.0–10.5)
nRBC: 0 % (ref 0.0–0.2)

## 2019-12-26 LAB — TROPONIN I (HIGH SENSITIVITY)
Troponin I (High Sensitivity): 3 ng/L (ref <18)
Troponin I (High Sensitivity): 3 ng/L (ref <18)
Troponin I (High Sensitivity): 4 ng/L (ref <18)

## 2019-12-26 LAB — D-DIMER, QUANTITATIVE: D-Dimer, Quant: 0.9 ug/mL-FEU — ABNORMAL HIGH (ref 0.00–0.50)

## 2019-12-26 MED ORDER — IOHEXOL 350 MG/ML SOLN
100.0000 mL | Freq: Once | INTRAVENOUS | Status: AC | PRN
  Administered 2019-12-26: 100 mL via INTRAVENOUS

## 2019-12-26 MED ORDER — IOHEXOL 350 MG/ML SOLN: 100.0000 mL | Freq: Once | INTRAVENOUS | Status: DC

## 2019-12-26 NOTE — ED Notes (Signed)
Pt transported to CT ?

## 2019-12-26 NOTE — ED Provider Notes (Signed)
[facility] EMERGENCY DEPARTMENT Provider Note   CSN: [id number] Arrival date & time: 12/26/19  1728     History Chief Complaint  Patient presents with  . Palpitations    Michael Howell is a 64 y.o. male history of diabetes, hypertension who presents for evaluation of intermittent palpitations, shortness of breath that has been ongoing for about a week.  He states that he first noticed it about a week ago.  He states he would have episodes where he would start to exert himself and he felt like he would get more winded.  He states he would take a while for him to calm down.  He never had any associated chest pain.  He states that he started noticing on his Fitbit that his heart rate would jump up into the 80s and 90s.  He reports that his heart rate is normally lower than that.  He notices particularly happen when he would exert himself like doing yard work.  He never had any associated chest pain.  He felt like today, got worse.  He noted his heart rate to be 110 and states that it took a while for it to come down.  Patient does report that he has been stressed more than normal.  He states that there has been several family situations that have been ongoing that have been causing him more stress and anxiety.  He also reports he has had a bout of poison ivy and states that he has had some associated diarrhea.  Patient states he has not been sick recently with fever or cough.  No known cardiac history.  No personal history of MI or family history of MI before the age of 32.  He does not smoke.  He does have history of hypertension and diabetes. He denies any exogenous hormone use, recent immobilization, prior history of DVT/PE, recent surgery, leg swelling, or long travel.  The history is provided by the patient.    HPI: A 64 year old patient with a history of treated diabetes and hypertension presents for evaluation of chest pain. Initial onset of pain was more than 6 hours ago. The  patient's chest pain is not worse with exertion. The patient's chest pain is not middle- or left-sided, is not well-localized, is not described as heaviness/pressure/tightness, is not sharp and does not radiate to the arms/jaw/neck. The patient does not complain of nausea and denies diaphoresis. The patient has no history of stroke, has no history of peripheral artery disease, has not smoked in the past 90 days, has no relevant family history of coronary artery disease (first degree relative at less than age 36), has no history of hypercholesterolemia and does not have an elevated BMI (>=30).   Past Medical History:  Diagnosis Date  . Diabetes mellitus   . Hypertension     There are no problems to display for this patient.   Past Surgical History:  Procedure Laterality Date  . BACK SURGERY         History reviewed. No pertinent family history.  Social History   Tobacco Use  . Smoking status: Never Smoker  . Smokeless tobacco: Never Used  Substance Use Topics  . Alcohol use: No  . Drug use: No    Home Medications Prior to Admission medications   Medication Sig Start Date End Date Taking? Authorizing Provider  cholecalciferol (VITAMIN D) 1000 UNITS tablet Take by mouth daily.    [provider]  cyclobenzaprine (FLEXERIL) 10 MG tablet Take 1 tablet (  10 mg total) by mouth 2 (two) times daily as needed for muscle spasms. 09/15/13   Szekalski, Kaitlyn, PA-C  glimepiride (AMARYL) 1 MG tablet Take 1 mg by mouth daily before breakfast.    [provider]  lisinopril (PRINIVIL,ZESTRIL) 10 MG tablet Take 10 mg by mouth daily.    [provider]  metFORMIN (GLUCOPHAGE) 1000 MG tablet Take 1,000 mg by mouth 2 (two) times daily with a meal.    [provider]  oxyCODONE-acetaminophen (PERCOCET/ROXICET) 5-325 MG tablet Take 2 tablets by mouth every 6 (six) hours as needed for severe pain. 01/10/19   Law, Bea Graff, PA-C  predniSONE (DELTASONE) 20 MG tablet  Take 3 tablets (60 mg total) by mouth daily. 01/10/19   Frederica Kuster, PA-C    Allergies    Patient has no known allergies.  Review of Systems   Review of Systems  Constitutional: Negative for fever.  Respiratory: Positive for shortness of breath. Negative for cough.   Cardiovascular: Positive for palpitations. Negative for chest pain and leg swelling.  Gastrointestinal: Negative for abdominal pain, nausea and vomiting.  Genitourinary: Negative for dysuria and hematuria.  Neurological: Negative for headaches.  All other systems reviewed and are negative.   Physical Exam Updated Vital Signs BP 119/66   Pulse 83   Temp 98.7 F (37.1 C) (Oral)   Resp 19   Ht 6' (1.829 m)   Wt 112 kg   SpO2 94%   BMI 33.50 kg/m   Physical Exam Vitals and nursing note reviewed.  Constitutional:      Appearance: Normal appearance. He is well-developed.  HENT:     Head: Normocephalic and atraumatic.  Eyes:     General: Lids are normal.     Conjunctiva/sclera: Conjunctivae normal.     Pupils: Pupils are equal, round, and reactive to light.  Cardiovascular:     Rate and Rhythm: Normal rate and regular rhythm.     Pulses: Normal pulses.          Radial pulses are 2+ on the right side and 2+ on the left side.       Dorsalis pedis pulses are 2+ on the right side and 2+ on the left side.     Heart sounds: Normal heart sounds. No murmur. No friction rub. No gallop.   Pulmonary:     Effort: Pulmonary effort is normal.     Breath sounds: Normal breath sounds.     Comments: Lungs clear to auscultation bilaterally.  Symmetric chest rise.  No wheezing, rales, rhonchi. Abdominal:     Palpations: Abdomen is soft. Abdomen is not rigid.     Tenderness: There is no abdominal tenderness. There is no guarding.  Musculoskeletal:        General: Normal range of motion.     Cervical back: Full passive range of motion without pain.     Comments: BLE are symmetric in appearance without any overlying warmth,  erythema or edema.   Skin:    General: Skin is warm and dry.     Capillary Refill: Capillary refill takes less than 2 seconds.  Neurological:     Mental Status: He is alert and oriented to person, place, and time.  Psychiatric:        Speech: Speech normal.     ED Results / Procedures / Treatments   Labs (all labs ordered are listed, but only abnormal results are displayed) Labs Reviewed  CBC WITH DIFFERENTIAL/PLATELET - Abnormal; Notable for the  following components:      Result Value   WBC 12.2 (*)    Neutro Abs 8.9 (*)    Abs Immature Granulocytes 0.10 (*)    All other components within normal limits  COMPREHENSIVE METABOLIC PANEL - Abnormal; Notable for the following components:   Sodium 134 (*)    Chloride 96 (*)    Glucose, Bld 260 (*)    All other components within normal limits  D-DIMER, QUANTITATIVE (NOT AT [facility]) - Abnormal; Notable for the following components:   D-Dimer, Quant 0.90 (*)    All other components within normal limits  TROPONIN I (HIGH SENSITIVITY)  TROPONIN I (HIGH SENSITIVITY)  TROPONIN I (HIGH SENSITIVITY)    EKG EKG Interpretation  Date/Time:  Wednesday Dec 26 2019 17:38:50 EDT Ventricular Rate:  104 PR Interval:  192 QRS Duration: 82 QT Interval:  316 QTC Calculation: 415 R Axis:   6 Text Interpretation: Sinus tachycardia Possible Left atrial enlargement Possible Anterior infarct , age undetermined Abnormal ECG No old tracing to compare Confirmed by Meridee Score [phone]) on 12/26/2019 6:00:06 PM   Radiology DG Chest 2 View  Result Date: 12/26/2019 CLINICAL DATA:  Palpitations. EXAM: CHEST - 2 VIEW COMPARISON:  Chest CT 09/15/2013 FINDINGS: Stable calcified granuloma in the right lower lobe.The cardiomediastinal contours are normal. The lungs are clear. Incidental azygos fissure again seen. Pulmonary vasculature is normal. No consolidation, pleural effusion, or pneumothorax. No acute osseous abnormalities are seen. There is degenerative  change in the spine. IMPRESSION: No acute chest findings. Electronically Signed   By: Narda Rutherford M.D.   On: 12/26/2019 18:03   CT Angio Chest PE W and/or Wo Contrast  Result Date: 12/26/2019 CLINICAL DATA:  PE suspected. Shortness of breath. EXAM: CT ANGIOGRAPHY CHEST WITH CONTRAST TECHNIQUE: Multidetector CT imaging of the chest was performed using the standard protocol during bolus administration of intravenous contrast. Multiplanar CT image reconstructions and MIPs were obtained to evaluate the vascular anatomy. CONTRAST:  OMNIPAQUE IOHEXOL 350 MG/ML SOLN COMPARISON:  None. FINDINGS: Cardiovascular: Contrast injection is sufficient to demonstrate satisfactory opacification of the pulmonary arteries to the segmental level. There is no pulmonary embolus. The main pulmonary artery is within normal limits for size. There is no CT evidence of acute right heart strain. The visualized aorta is normal. Heart size is normal. There are coronary artery calcifications. Mediastinum/Nodes: --there are calcified hilar and mediastinal lymph nodes on the right consistent prior granulomatous infection. --No axillary lymphadenopathy. --No supraclavicular lymphadenopathy. --there is a calcified right-sided thyroid nodule that appears to be measuring at least 2.4 cm. --The esophagus is unremarkable Lungs/Pleura: There is an azygos lobe, a normal variant. There are calcified pulmonary nodules primarily within the right lower lobe consistent with a prior granulomatous infection. The lungs are otherwise clear without evidence for pneumothorax or significant pleural effusion. Upper Abdomen: There are innumerable calcifications in the patient's spleen consistent with a prior granulomatous infection. There is no acute abnormality in the upper abdomen. Musculoskeletal: No chest wall abnormality. No acute or significant osseous findings. There is an area of sclerosis involving the right scapula which is felt to represent a  bone island. This is essentially stable since 2015. Review of the MIP images confirms the above findings. IMPRESSION: 1. No evidence of acute pulmonary embolism. 2. Coronary artery disease. 3. Sequelae of prior granulomatous infection involving the lungs and spleen. 4. Right-sided calcified thyroid nodule that appears to be measuring at least 2.4 cm. Outpatient thyroid ultrasound is recommended for further  evaluation of this finding.(Ref: J Am Coll Radiol. 2015 Feb;12(2): 143-50). Electronically Signed   By: Katherine Mantlehristopher  Green M.D.   On: 12/26/2019 22:36    Procedures Procedures (including critical care time)  Medications Ordered in ED Medications  iohexol (OMNIPAQUE) 350 MG/ML injection 100 mL (100 mLs Intravenous Contrast Given 12/26/19 2151)    ED Course  I have reviewed the triage vital signs and the nursing notes.  Pertinent labs & imaging results that were available during my care of the patient were reviewed by me and considered in my medical decision making (see chart for details).  Clinical Course as of Dec 25 2337  Wed Dec 26, 2019  [id number] 64 year old male here with increased palpitations over the past week or so.  Associated with some shortness of breath.  Troponin unremarkable.  D-dimer elevated and has a family history of clotting so getting a CT PE.  Disposition per results of testing.   [MB]    Clinical Course User Index [MB] Terrilee FilesButler, Michael C, MD   MDM Rules/Calculators/A&P HEAR Score: 2                    64 year old male who presents for evaluation of palpitations, shortness of breath that is been ongoing for the last week.  Felt like it worsened today.  No chest pain.  Does feel like he has been stressed recently.  On initial arrival, he is afebrile, nontoxic-appearing.  Vital signs are stable.  No evidence of hypoxia or tachycardia.  On exam, he has equal pulses.  Low suspicion for ACS etiology.  This does not sound like unstable angina or ACS etiology.  Does not have any  associated chest pain.  He does state that he focuses on his heart rate and looks at his Fitbit.  He has noticed that his heart rate has been in the 80s and 90s which he considers high for him.  No PE risk factors but does report feeling dyspnea on exertion as well as palpitations with exertion.  We will plan to check basic labs, EKG, Trope, D-dimer given age, symptoms.  Initial troponin negative.  D-dimer slightly elevated at 0.90.  CBC shows slight leukocytosis of 12.2.  CMP shows normal BUN and creatinine.  Chest x-ray negative for any acute abnormalities.  Given elevated D-dimer, will plan for CTA for further evaluation.  Patient states that his daughter was recently diagnosed with coagulation disorder that they thought may be hereditary.  He states that he has not known what that is but he and his wife have not gotten tested yet.  CTA shows no evidence of PE.  There is mention of a calcified thyroid nodule.  Additionally, he has evidence of a small pulmonary nodule that was seen in his previous CT scans in 2015.  Delta troponin negative.  Given patient's history/risk factors, he has a heart score of 2.  His presentation is not typical of ACS etiology or unstable angina.  Given his reassuring heart score, 2 - delta troponins, feel that he can be reasonably discharged.  Discussed with Dr. Charm BargesButler who is agreeable to plan.  Discussed results with patient.  I discussed with patient regarding his thyroid ultrasound and need for follow-up evaluation.  Additionally, I discussed with patient regarding following up with his primary care doctor.  If he continues to have palpitation, he may need a Holter monitor. At this time, patient exhibits no emergent life-threatening condition that require further evaluation in ED or admission. Patient had ample  opportunity for questions and discussion. All patient's questions were answered with full understanding. Strict return precautions discussed. Patient expresses  understanding and agreement to plan.   Portions of this note were generated with Scientist, clinical (histocompatibility and immunogenetics). Dictation errors may occur despite best attempts at proofreading.   Final Clinical Impression(s) / ED Diagnoses Final diagnoses:  Palpitations  Thyroid nodule  Pulmonary nodule    Rx / DC Orders ED Discharge Orders    None       Rosana Hoes 12/26/19 2339    Terrilee Files, MD 12/27/19 [phone]

## 2019-12-26 NOTE — ED Triage Notes (Addendum)
C/o palpitations x 1 week , increased stress x several weeks

## 2019-12-26 NOTE — Discharge Instructions (Addendum)
As we discussed, your work-up today was reassuring.  Your CT scan did not show any evidence of blood clots.  Did show evidence of a small calcified thyroid nodule.  This needs to be followed up with either your primary care doctor or your endocrinologist with thyroid ultrasound.  Additionally, you should have thyroid studies done.  As we discussed, you need to follow-up with your primary care doctor.  If you continue have palpitations, you may need a Holter monitor at home to monitor your symptoms.  Additionally, given your daughter's recent history, your primary care doctor can draw a coagulation panel.  Additionally, as we discussed your CT scan showed a small pulmonary nodule.  This area has been seen before and appears stable.  This just needs monitoring by your primary care doctor.  Return the emergency department for any chest pain, difficulty breathing, vomiting, fevers or any other worsening or concerning symptoms.

## 2019-12-31 DIAGNOSIS — R918 Other nonspecific abnormal finding of lung field: Secondary | ICD-10-CM | POA: Diagnosis not present

## 2019-12-31 DIAGNOSIS — E041 Nontoxic single thyroid nodule: Secondary | ICD-10-CM | POA: Diagnosis not present

## 2020-02-27 DIAGNOSIS — L97521 Non-pressure chronic ulcer of other part of left foot limited to breakdown of skin: Secondary | ICD-10-CM | POA: Diagnosis not present

## 2020-02-27 DIAGNOSIS — M7752 Other enthesopathy of left foot: Secondary | ICD-10-CM | POA: Diagnosis not present

## 2020-02-27 DIAGNOSIS — L97501 Non-pressure chronic ulcer of other part of unspecified foot limited to breakdown of skin: Secondary | ICD-10-CM | POA: Diagnosis not present

## 2020-02-27 DIAGNOSIS — E11621 Type 2 diabetes mellitus with foot ulcer: Secondary | ICD-10-CM | POA: Diagnosis not present

## 2020-03-05 DIAGNOSIS — E11621 Type 2 diabetes mellitus with foot ulcer: Secondary | ICD-10-CM | POA: Diagnosis not present

## 2020-03-05 DIAGNOSIS — M2032 Hallux varus (acquired), left foot: Secondary | ICD-10-CM | POA: Diagnosis not present

## 2020-03-05 DIAGNOSIS — L97501 Non-pressure chronic ulcer of other part of unspecified foot limited to breakdown of skin: Secondary | ICD-10-CM | POA: Diagnosis not present

## 2020-03-14 DIAGNOSIS — L97521 Non-pressure chronic ulcer of other part of left foot limited to breakdown of skin: Secondary | ICD-10-CM | POA: Diagnosis not present

## 2020-03-14 DIAGNOSIS — E11621 Type 2 diabetes mellitus with foot ulcer: Secondary | ICD-10-CM | POA: Diagnosis not present

## 2020-03-14 DIAGNOSIS — E119 Type 2 diabetes mellitus without complications: Secondary | ICD-10-CM | POA: Diagnosis not present

## 2020-03-14 DIAGNOSIS — M89372 Hypertrophy of bone, left ankle and foot: Secondary | ICD-10-CM | POA: Diagnosis not present

## 2020-03-28 DIAGNOSIS — M89372 Hypertrophy of bone, left ankle and foot: Secondary | ICD-10-CM | POA: Diagnosis not present

## 2020-04-04 DIAGNOSIS — M89372 Hypertrophy of bone, left ankle and foot: Secondary | ICD-10-CM | POA: Diagnosis not present

## 2020-04-30 DIAGNOSIS — E041 Nontoxic single thyroid nodule: Secondary | ICD-10-CM | POA: Diagnosis not present

## 2020-04-30 DIAGNOSIS — Z794 Long term (current) use of insulin: Secondary | ICD-10-CM | POA: Diagnosis not present

## 2020-04-30 DIAGNOSIS — E782 Mixed hyperlipidemia: Secondary | ICD-10-CM | POA: Diagnosis not present

## 2020-04-30 DIAGNOSIS — I10 Essential (primary) hypertension: Secondary | ICD-10-CM | POA: Diagnosis not present

## 2020-04-30 DIAGNOSIS — E119 Type 2 diabetes mellitus without complications: Secondary | ICD-10-CM | POA: Diagnosis not present

## 2020-05-16 ENCOUNTER — Telehealth: Payer: Self-pay | Admitting: General Practice

## 2020-05-16 NOTE — Telephone Encounter (Signed)
At your convenience could you schedule this patient a NP appointment

## 2020-05-16 NOTE — Telephone Encounter (Signed)
Ok to schedule. Ty.

## 2020-05-16 NOTE — Telephone Encounter (Signed)
Caller Michael Howell  Call Back # [phone]  Pt is requesting to establish care with you . Patient's wife Michael Howell sees you.    Please Advise

## 2020-05-26 NOTE — Telephone Encounter (Signed)
Called to patient back to schedule appointment, no answer left vm

## 2020-06-09 ENCOUNTER — Ambulatory Visit: Payer: BC Managed Care – PPO | Admitting: Family Medicine

## 2020-07-10 ENCOUNTER — Ambulatory Visit: Payer: BC Managed Care – PPO | Admitting: Family Medicine

## 2020-07-10 ENCOUNTER — Other Ambulatory Visit: Payer: Self-pay

## 2020-07-10 ENCOUNTER — Encounter: Payer: Self-pay | Admitting: Family Medicine

## 2020-07-10 VITALS — BP 138/70 | HR 85 | Temp 97.6°F | Wt 238.8 lb

## 2020-07-10 DIAGNOSIS — G4733 Obstructive sleep apnea (adult) (pediatric): Secondary | ICD-10-CM | POA: Diagnosis not present

## 2020-07-10 DIAGNOSIS — Z1211 Encounter for screening for malignant neoplasm of colon: Secondary | ICD-10-CM

## 2020-07-10 DIAGNOSIS — I10 Essential (primary) hypertension: Secondary | ICD-10-CM | POA: Insufficient documentation

## 2020-07-10 DIAGNOSIS — Z9989 Dependence on other enabling machines and devices: Secondary | ICD-10-CM

## 2020-07-10 DIAGNOSIS — M65342 Trigger finger, left ring finger: Secondary | ICD-10-CM | POA: Diagnosis not present

## 2020-07-10 NOTE — Progress Notes (Signed)
CC: Est care     New Patient Visit SUBJECTIVE: HPI: Michael Howell is an 64 y.o.male who is being seen for establishing care.  The patient was previously seen at [facility], but his provider retired.  Hypertension Patient presents for hypertension follow up. He does not monitor home blood pressures. He is compliant with medications- Prinzide. Patient has these side effects of medication: none He is adhering to a healthy diet overall. Exercise: walking  Patient has a history of OSA on CPAP.  He does not have a sleep provider currently.  His last sleep study was in 2007.  His current machine is starting to get old and he wonders if it needs to be replaced.  It does help with his symptoms of daytime fatigue, headaches, and elevated blood pressure.  The patient has had several months of left hand pain.  Initially, his left ring finger would get stuck.  It does not seem to get stuck anymore but he does have decreased range of motion in the digit.  There is some swelling.  There is pain of the palm region.  No redness, bruising, or injury.  He has not tried anything other than ice.  Past Medical History:  Diagnosis Date  . Diabetes mellitus   . Hypertension    Past Surgical History:  Procedure Laterality Date  . BACK SURGERY     History reviewed. No pertinent family history. No Known Allergies  Current Outpatient Medications:  .  cholecalciferol (VITAMIN D) 1000 UNITS tablet, Take by mouth daily., Disp: , Rfl:  .  JARDIANCE 10 MG TABS tablet, Take 10 mg by mouth daily., Disp: , Rfl:  .  lisinopril-hydrochlorothiazide (ZESTORETIC) 10-12.5 MG tablet, lisinopril 10 mg-hydrochlorothiazide 12.5 mg tablet, Disp: , Rfl:  .  pravastatin (PRAVACHOL) 40 MG tablet, Take 1 tablet by mouth at bedtime., Disp: , Rfl:  .  VITAMIN D, CHOLECALCIFEROL, PO, Take by mouth., Disp: , Rfl:   OBJECTIVE: BP 138/70 (BP Location: Right Arm, Patient Position: Sitting, Cuff Size: Large)   Pulse 85   Temp 97.6 F  (36.4 C) (Temporal)   Wt 238 lb 12.8 oz (108.3 kg)   SpO2 97%   BMI 32.39 kg/m  General:  well developed, well nourished, in no apparent distress Skin:  no significant moles, warts, or growths Lungs:  clear to auscultation, breath sounds equal bilaterally, no respiratory distress Cardio:  regular rate and rhythm, no LE edema or bruits Musculoskeletal: Tenderness to palpation over the fourth distal palmar flexor tendon on the left; there is no current triggering of the finger, there is no deformity Psych: well oriented with normal range of affect and appropriate judgment/insight  ASSESSMENT/PLAN: OSA on CPAP - Plan: Ambulatory referral to Pulmonology  Essential hypertension  Trigger ring finger of left hand  Screen for colon cancer - Plan: Ambulatory referral to Gastroenterology  1.  Refer to pulmonology/sleep team. 2.  Continue Prinzide 10-12.5 mg daily.  Counseled on diet and exercise.  Does not need to monitor blood pressures. 3.  Reassurance.  I did offer injection versus referral to hand.  He will let me know if he changes his mind. 4.  Refer to GI for colonoscopy. Patient should return in 6 months for physical. The patient voiced understanding and agreement to the plan.   Jilda Roche [place], DO 07/10/20  9:48 AM

## 2020-07-10 NOTE — Patient Instructions (Signed)
Keep the diet clean and stay active.  If you do not hear anything about your referrals in the next 1-2 weeks, call our office and ask for an update.  Let us know if you need anything.

## 2020-07-22 ENCOUNTER — Encounter: Payer: Self-pay | Admitting: Gastroenterology

## 2020-09-09 ENCOUNTER — Other Ambulatory Visit: Payer: Self-pay

## 2020-09-09 ENCOUNTER — Ambulatory Visit (INDEPENDENT_AMBULATORY_CARE_PROVIDER_SITE_OTHER): Payer: BC Managed Care – PPO | Admitting: Pulmonary Disease

## 2020-09-09 ENCOUNTER — Encounter: Payer: Self-pay | Admitting: Pulmonary Disease

## 2020-09-09 VITALS — BP 138/70 | HR 83 | Temp 98.3°F | Ht 72.0 in | Wt 292.4 lb

## 2020-09-09 DIAGNOSIS — Z9989 Dependence on other enabling machines and devices: Secondary | ICD-10-CM | POA: Diagnosis not present

## 2020-09-09 DIAGNOSIS — G4733 Obstructive sleep apnea (adult) (pediatric): Secondary | ICD-10-CM | POA: Diagnosis not present

## 2020-09-09 NOTE — Progress Notes (Signed)
Michael Howell    [id number]    12/21/1955  Primary Care Physician:Wendling, Jilda Roche, DO  Referring Physician: Sharlene Dory, DO [address] [place],  [place] [address]  Chief complaint:   Obstructive sleep apnea  HPI:  Patient diagnosed with obstructive sleep apnea about 2008 His most recent machine is from 2012  Patient recently has been coming up with warnings of being outside warranted.  Wears his CPAP every night Continues to benefit from CPAP use Sleep is very restorative  Denies any shortness of breath No headaches No dryness of his mouth Memory is good  He has hypertension, diabetes-both well controlled  Weight is consistently improving  He has no concerns about his CPAP at present  Outpatient Encounter Medications as of 09/09/2020  Medication Sig  . cholecalciferol (VITAMIN D) 1000 UNITS tablet Take by mouth daily.  Marland Kitchen JARDIANCE 10 MG TABS tablet Take 10 mg by mouth daily.  Marland Kitchen lisinopril-hydrochlorothiazide (ZESTORETIC) 10-12.5 MG tablet lisinopril 10 mg-hydrochlorothiazide 12.5 mg tablet  . pravastatin (PRAVACHOL) 40 MG tablet Take 1 tablet by mouth at bedtime.  . sitaGLIPtin-metformin (JANUMET) 50-1000 MG tablet Take by mouth.  Marland Kitchen VITAMIN D, CHOLECALCIFEROL, PO Take by mouth.   No facility-administered encounter medications on file as of 09/09/2020.    Allergies as of 09/09/2020  . (No Known Allergies)    Past Medical History:  Diagnosis Date  . Diabetes mellitus   . Hypertension     Past Surgical History:  Procedure Laterality Date  . BACK SURGERY      No family history on file.  Social History   Socioeconomic History  . Marital status: Married    Spouse name: Not on file  . Number of children: Not on file  . Years of education: Not on file  . Highest education level: Not on file  Occupational History  . Not on file  Tobacco Use  . Smoking status: Never Smoker  . Smokeless tobacco: Never Used   Vaping Use  . Vaping Use: Never used  Substance and Sexual Activity  . Alcohol use: No  . Drug use: No  . Sexual activity: Not on file  Other Topics Concern  . Not on file  Social History Narrative  . Not on file   Social Determinants of Health   Financial Resource Strain: Not on file  Food Insecurity: Not on file  Transportation Needs: Not on file  Physical Activity: Not on file  Stress: Not on file  Social Connections: Not on file  Intimate Partner Violence: Not on file    Review of Systems  Constitutional: Negative for fatigue.  Respiratory: Positive for apnea. Negative for shortness of breath.   Psychiatric/Behavioral: Positive for sleep disturbance.    Vitals:   09/09/20 1031  BP: 138/70  Pulse: 83  Temp: 98.3 F (36.8 C)  SpO2: 98%     Physical Exam Constitutional:      Appearance: He is obese.  HENT:     Head: Normocephalic.     Mouth/Throat:     Mouth: Mucous membranes are moist.  Cardiovascular:     Rate and Rhythm: Normal rate and regular rhythm.     Heart sounds: No murmur heard. No friction rub.  Pulmonary:     Effort: Pulmonary effort is normal. No respiratory distress.     Breath sounds: No stridor. No wheezing or rhonchi.  Musculoskeletal:     Cervical back: No rigidity or  tenderness.  Neurological:     Mental Status: He is alert.  Psychiatric:        Mood and Affect: Mood normal.    Data Reviewed: Compliance data reviewed showing 100% compliance Machine set at 12 Residual AHI of 0.6  Assessment:  History of obstructive sleep apnea -Excellent compliance with CPAP -No significant daytime symptoms  Obesity -Continues to work on weight loss and losing weight  Denies any significant sleepiness during the day  Machine is dated  Plan/Recommendations: Schedule the patient for home sleep study  Encouraged to continue using current CPAP  Continue with weight loss efforts  Tentative follow-up following mission  upgrade   Virl Diamond MD [facility] 09/09/2020, 10:35 AM  CC: Sharlene Dory*

## 2020-09-09 NOTE — Patient Instructions (Signed)
History of obstructive sleep apnea  Your compliance shows excellent compliance with very good treatment   we will schedule you for home sleep study  Update you with results as soon as reviewed Contact medical supply company to initiate a new CPAP device  I will see you a few weeks following initiation of new treatment

## 2020-09-17 ENCOUNTER — Other Ambulatory Visit: Payer: Self-pay

## 2020-09-17 ENCOUNTER — Ambulatory Visit: Payer: BC Managed Care – PPO

## 2020-09-17 DIAGNOSIS — G4733 Obstructive sleep apnea (adult) (pediatric): Secondary | ICD-10-CM

## 2020-09-17 DIAGNOSIS — Z9989 Dependence on other enabling machines and devices: Secondary | ICD-10-CM

## 2020-09-19 ENCOUNTER — Telehealth: Payer: Self-pay | Admitting: Pulmonary Disease

## 2020-09-19 ENCOUNTER — Other Ambulatory Visit: Payer: Self-pay

## 2020-09-19 ENCOUNTER — Ambulatory Visit (AMBULATORY_SURGERY_CENTER): Payer: Self-pay

## 2020-09-19 VITALS — Ht 72.0 in | Wt 239.0 lb

## 2020-09-19 DIAGNOSIS — Z1211 Encounter for screening for malignant neoplasm of colon: Secondary | ICD-10-CM

## 2020-09-19 DIAGNOSIS — G4733 Obstructive sleep apnea (adult) (pediatric): Secondary | ICD-10-CM

## 2020-09-19 MED ORDER — PLENVU 140 G PO SOLR: 1.0000 | ORAL | 0 refills | Status: DC

## 2020-09-19 NOTE — Telephone Encounter (Signed)
Called and spoke with patient to let him know results of sleep study. Patient informed me that he is currently on CPAP but is getting warning messages and needs a new machine and would like to switch DME companies. Order has been placed. Nothing further needed at this time.

## 2020-09-19 NOTE — Telephone Encounter (Signed)
Call patient  Sleep study result  Date of study: 09/17/2020  Impression: Mild obstructive sleep apnea No significant oxygen desaturations  Recommendation: Treatment for mild obstructive sleep apnea will include 1.  CPAP therapy 2.  An oral device 3.  Watchful waiting with weight loss efforts and exercise  Patient currently on CPAP treatment and benefiting from treatment  I will recommend continuation with CPAP therapy  Auto titrating CPAP with pressure settings of 5-15 will be appropriate

## 2020-09-19 NOTE — Progress Notes (Signed)
No egg or soy allergy known to patient  No issues with past sedation with any surgeries or procedures No intubation problems in the past  No FH of Malignant Hyperthermia No diet pills per patient No home 02 use per patient  No blood thinners per patient  Pt denies issues with constipation  No A fib or A flutter  EMMI video via MyChart  COVID 19 guidelines implemented in PV today with Pt and RN  Pt is fully vaccinated for Covid x 2 + booster= Pt denies loose teeth--reports missing teeth Patient denies dentures, partials, or bonded teeth---reports dental implants and crown on lower right, upper left dental implant with a crown; Coupon given to pt in PV today , Code to Pharmacy and  NO PA's for preps discussed with pt In PV today  Discussed with pt there will be an out-of-pocket cost for prep and that varies from $0 to 70 dollars  Due to the COVID-19 pandemic we are asking patients to follow certain guidelines.  Pt aware of COVID protocols and [facility] guidelines

## 2020-10-01 ENCOUNTER — Encounter: Payer: Self-pay | Admitting: Gastroenterology

## 2020-10-02 ENCOUNTER — Encounter: Payer: Self-pay | Admitting: Certified Registered Nurse Anesthetist

## 2020-10-03 ENCOUNTER — Ambulatory Visit (AMBULATORY_SURGERY_CENTER): Payer: BC Managed Care – PPO | Admitting: Gastroenterology

## 2020-10-03 ENCOUNTER — Encounter: Payer: Self-pay | Admitting: Gastroenterology

## 2020-10-03 ENCOUNTER — Other Ambulatory Visit: Payer: Self-pay

## 2020-10-03 VITALS — BP 116/74 | HR 82 | Temp 97.7°F | Resp 17 | Ht 72.0 in | Wt 293.0 lb

## 2020-10-03 DIAGNOSIS — Z1211 Encounter for screening for malignant neoplasm of colon: Secondary | ICD-10-CM

## 2020-10-03 DIAGNOSIS — Z800 Family history of malignant neoplasm of digestive organs: Secondary | ICD-10-CM | POA: Diagnosis not present

## 2020-10-03 MED ORDER — SODIUM CHLORIDE 0.9 % IV SOLN: 500.0000 mL | INTRAVENOUS | Status: DC

## 2020-10-03 NOTE — Progress Notes (Signed)
Called to room to assist during endoscopic procedure.  Patient ID and intended procedure confirmed with present staff. Received instructions for my participation in the procedure from the performing physician.  

## 2020-10-03 NOTE — Progress Notes (Signed)
Report given to PACU, vss 

## 2020-10-03 NOTE — Progress Notes (Signed)
No problems noted in the recovery room. maw 

## 2020-10-03 NOTE — Patient Instructions (Addendum)
Handouts were given to your care partner on diverticulosis and hemorrhoids. Your sugar was 200 in the recovery room. You may resume your current medications today. Repeat colonoscopy in 5 years for screening purposes. Please call if any questions or concerns.    YOU HAD AN ENDOSCOPIC PROCEDURE TODAY AT THE [facility]:   Refer to the procedure report that was given to you for any specific questions about what was found during the examination.  If the procedure report does not answer your questions, please call your gastroenterologist to clarify.  If you requested that your care partner not be given the details of your procedure findings, then the procedure report has been included in a sealed envelope for you to review at your convenience later.  YOU SHOULD EXPECT: Some feelings of bloating in the abdomen. Passage of more gas than usual.  Walking can help get rid of the air that was put into your GI tract during the procedure and reduce the bloating. If you had a lower endoscopy (such as a colonoscopy or flexible sigmoidoscopy) you may notice spotting of blood in your stool or on the toilet paper. If you underwent a bowel prep for your procedure, you may not have a normal bowel movement for a few days.  Please Note:  You might notice some irritation and congestion in your nose or some drainage.  This is from the oxygen used during your procedure.  There is no need for concern and it should clear up in a day or so.  SYMPTOMS TO REPORT IMMEDIATELY:   Following lower endoscopy (colonoscopy or flexible sigmoidoscopy):  Excessive amounts of blood in the stool  Significant tenderness or worsening of abdominal pains  Swelling of the abdomen that is new, acute  Fever of 100F or higher   For urgent or emergent issues, a gastroenterologist can be reached at any hour by calling [phone]. Do not use MyChart messaging for urgent concerns.    DIET:  We do recommend a small meal at  first, but then you may proceed to your regular diet.  Drink plenty of fluids but you should avoid alcoholic beverages for 24 hours.  ACTIVITY:  You should plan to take it easy for the rest of today and you should NOT DRIVE or use heavy machinery until tomorrow (because of the sedation medicines used during the test).    FOLLOW UP: Our staff will call the number listed on your records 48-72 hours following your procedure to check on you and address any questions or concerns that you may have regarding the information given to you following your procedure. If we do not reach you, we will leave a message.  We will attempt to reach you two times.  During this call, we will ask if you have developed any symptoms of COVID 19. If you develop any symptoms (ie: fever, flu-like symptoms, shortness of breath, cough etc.) before then, please call [phone].  If you test positive for Covid 19 in the 2 weeks post procedure, please call and report this information to [place].    If any biopsies were taken you will be contacted by phone or by letter within the next 1-3 weeks.  Please call us at [phone] if you have not heard about the biopsies in 3 weeks.    SIGNATURES/CONFIDENTIALITY: You and/or your care partner have signed paperwork which will be entered into your electronic medical record.  These signatures attest to the fact that that the information above on  your After Visit Summary has been reviewed and is understood.  Full responsibility of the confidentiality of this discharge information lies with you and/or your care-partner. 

## 2020-10-03 NOTE — Op Note (Signed)
[facility] Patient Name: Michael Howell Procedure Date: 10/03/2020 8:36 AM MRN: [id number] Endoscopist: Lynann Bologna , MD Age: 65 Referring MD:  Date of Birth: 1955-09-13 Gender: Male Account #: [id number] Procedure:                Colonoscopy Indications:              Colon cancer screening in patient at increased                            risk: Family history of 1st-degree relative with                            colon polyps Medicines:                Monitored Anesthesia Care Procedure:                Pre-Anesthesia Assessment:                           - Prior to the procedure, a History and Physical                            was performed, and patient medications and                            allergies were reviewed. The patient's tolerance of                            previous anesthesia was also reviewed. The risks                            and benefits of the procedure and the sedation                            options and risks were discussed with the patient.                            All questions were answered, and informed consent                            was obtained. Prior Anticoagulants: The patient has                            taken no previous anticoagulant or antiplatelet                            agents. ASA Grade Assessment: II - A patient with                            mild systemic disease. After reviewing the risks                            and benefits, the patient was deemed in  satisfactory condition to undergo the procedure.                           After obtaining informed consent, the colonoscope                            was passed under direct vision. Throughout the                            procedure, the patient's blood pressure, pulse, and                            oxygen saturations were monitored continuously. The                            Olympus CF-[id number](#[id number]) Colonoscope was                             introduced through the anus and advanced to the the                            cecum, identified by appendiceal orifice and                            ileocecal valve. The colonoscopy was performed                            without difficulty. The patient tolerated the                            procedure well. The quality of the bowel                            preparation was good. The ileocecal valve,                            appendiceal orifice, and rectum were photographed. Scope In: 8:39:11 AM Scope Out: 8:59:24 AM Scope Withdrawal Time: 0 hours 16 minutes 47 seconds  Total Procedure Duration: 0 hours 20 minutes 13 seconds  Findings:                 A few small-mouthed diverticula were found in the                            sigmoid colon.                           Non-bleeding internal hemorrhoids were found during                            retroflexion. The hemorrhoids were moderate.                           The exam was otherwise without abnormality on  direct and retroflexion views. Complications:            No immediate complications. Estimated Blood Loss:     Estimated blood loss: none. Impression:               - Minimal sigmoid diverticulosis.                           - Non-bleeding internal hemorrhoids.                           - The examination was otherwise normal on direct                            and retroflexion views.                           - No specimens collected. Recommendation:           - Patient has a contact number available for                            emergencies. The signs and symptoms of potential                            delayed complications were discussed with the                            patient. Return to normal activities tomorrow.                            Written discharge instructions were provided to the                            patient.                           - Resume previous  diet.                           - Continue present medications.                           - Repeat colonoscopy in 5 years for screening                            purposes. Earlier, if with any new problems or                            change in family history.                           - Return to GI clinic PRN.                           - The findings and recommendations were discussed  with the patient's family. Lynann Bologna, MD 10/03/2020 9:04:11 AM This report has been signed electronically.

## 2020-10-07 ENCOUNTER — Telehealth: Payer: Self-pay

## 2020-10-07 NOTE — Telephone Encounter (Signed)
  Follow up Call-  Call back number 10/03/2020  Post procedure Call Back phone  # [phone]  Permission to leave phone message Yes  Some recent data might be hidden     Patient questions:  Do you have a fever, pain , or abdominal swelling? No. Pain Score  0 *  Have you tolerated food without any problems? Yes.    Have you been able to return to your normal activities? Yes.    Do you have any questions about your discharge instructions: Diet   No. Medications  No. Follow up visit  No.  Do you have questions or concerns about your Care? No.  Actions: * If pain score is 4 or above: 1. No action needed, pain <4.Have you developed a fever since your procedure? no  2.   Have you had an respiratory symptoms (SOB or cough) since your procedure? no  3.   Have you tested positive for COVID 19 since your procedure no  4.   Have you had any family members/close contacts diagnosed with the COVID 19 since your procedure?  no   If yes to any of these questions please route to Laverna Peace, RN and Karlton Lemon, RN

## 2020-10-28 DIAGNOSIS — Z794 Long term (current) use of insulin: Secondary | ICD-10-CM | POA: Diagnosis not present

## 2020-10-28 DIAGNOSIS — E782 Mixed hyperlipidemia: Secondary | ICD-10-CM | POA: Diagnosis not present

## 2020-10-28 DIAGNOSIS — I10 Essential (primary) hypertension: Secondary | ICD-10-CM | POA: Diagnosis not present

## 2020-10-28 DIAGNOSIS — E119 Type 2 diabetes mellitus without complications: Secondary | ICD-10-CM | POA: Diagnosis not present

## 2021-01-12 ENCOUNTER — Other Ambulatory Visit: Payer: Self-pay

## 2021-01-12 ENCOUNTER — Ambulatory Visit (INDEPENDENT_AMBULATORY_CARE_PROVIDER_SITE_OTHER): Payer: BC Managed Care – PPO | Admitting: Family Medicine

## 2021-01-12 ENCOUNTER — Encounter: Payer: Self-pay | Admitting: Family Medicine

## 2021-01-12 VITALS — BP 130/64 | HR 83 | Temp 98.8°F | Ht 72.0 in | Wt 291.4 lb

## 2021-01-12 DIAGNOSIS — Z23 Encounter for immunization: Secondary | ICD-10-CM | POA: Diagnosis not present

## 2021-01-12 DIAGNOSIS — Z0000 Encounter for general adult medical examination without abnormal findings: Secondary | ICD-10-CM | POA: Diagnosis not present

## 2021-01-12 DIAGNOSIS — E785 Hyperlipidemia, unspecified: Secondary | ICD-10-CM | POA: Diagnosis not present

## 2021-01-12 DIAGNOSIS — Z114 Encounter for screening for human immunodeficiency virus [HIV]: Secondary | ICD-10-CM

## 2021-01-12 DIAGNOSIS — Z125 Encounter for screening for malignant neoplasm of prostate: Secondary | ICD-10-CM

## 2021-01-12 DIAGNOSIS — Z1159 Encounter for screening for other viral diseases: Secondary | ICD-10-CM | POA: Diagnosis not present

## 2021-01-12 DIAGNOSIS — Z136 Encounter for screening for cardiovascular disorders: Secondary | ICD-10-CM

## 2021-01-12 LAB — CBC
HCT: 47 % (ref 39.0–52.0)
Hemoglobin: 16.1 g/dL (ref 13.0–17.0)
MCHC: 34.2 g/dL (ref 30.0–36.0)
MCV: 89.4 fl (ref 78.0–100.0)
Platelets: 329 10*3/uL (ref 150.0–400.0)
RBC: 5.26 Mil/uL (ref 4.22–5.81)
RDW: 13.6 % (ref 11.5–15.5)
WBC: 8 10*3/uL (ref 4.0–10.5)

## 2021-01-12 LAB — COMPREHENSIVE METABOLIC PANEL
ALT: 38 U/L (ref 0–53)
AST: 24 U/L (ref 0–37)
Albumin: 4.9 g/dL (ref 3.5–5.2)
Alkaline Phosphatase: 61 U/L (ref 39–117)
BUN: 17 mg/dL (ref 6–23)
CO2: 28 mEq/L (ref 19–32)
Calcium: 10.3 mg/dL (ref 8.4–10.5)
Chloride: 97 mEq/L (ref 96–112)
Creatinine, Ser: 0.97 mg/dL (ref 0.40–1.50)
GFR: 82.01 mL/min (ref >60.00)
Glucose, Bld: 205 mg/dL — ABNORMAL HIGH (ref 70–99)
Potassium: 4.9 mEq/L (ref 3.5–5.1)
Sodium: 137 mEq/L (ref 135–145)
Total Bilirubin: 0.6 mg/dL (ref 0.2–1.2)
Total Protein: 7.4 g/dL (ref 6.0–8.3)

## 2021-01-12 LAB — PSA: PSA: 0.87 ng/mL (ref 0.10–4.00)

## 2021-01-12 LAB — LIPID PANEL
Cholesterol: 170 mg/dL (ref 0–200)
HDL: 42.8 mg/dL (ref >39.00)
LDL Cholesterol: 97 mg/dL (ref 0–99)
NonHDL: 127.42
Total CHOL/HDL Ratio: 4
Triglycerides: 152 mg/dL — ABNORMAL HIGH (ref 0.0–149.0)
VLDL: 30.4 mg/dL (ref 0.0–40.0)

## 2021-01-12 NOTE — Patient Instructions (Addendum)

## 2021-01-12 NOTE — Progress Notes (Signed)
Chief Complaint  Patient presents with  . Annual Exam    Well Male Michael Howell is here for a complete physical.   His last physical was >1 year ago.  Current diet: in general, a "healthy" diet.   Current exercise: walking, swimming, active in yard  Weight trend: stable Fatigue out of ordinary? No. Seat belt? Yes.    Health maintenance Shingrix- No Colonoscopy- Yes Tetanus- Yes Hep C- No HIV screening- No Pneumonia vaccine- No AAA screening- No  Past Medical History:  Diagnosis Date  . Cataract    not a surgical candiate at this time (09/19/2020)  . Diabetes mellitus    on meds  . Hyperlipidemia    on meds  . Hypertension    on meds  . Sleep apnea    uses CPAP     Past Surgical History:  Procedure Laterality Date  . BACK SURGERY  1988   lumbar spine (L5) ruptured disc repaired    Medications  Current Outpatient Medications on File Prior to Visit  Medication Sig Dispense Refill  . aspirin 81 MG chewable tablet Chew 1 tablet by mouth daily.    Marland Kitchen glucose blood (ONETOUCH VERIO) test strip Use 1 times daily    . ibuprofen (ADVIL) 600 MG tablet daily as needed.    Marland Kitchen JARDIANCE 10 MG TABS tablet Take 10 mg by mouth daily.    . Lancets (ONETOUCH DELICA PLUS LANCET33G) MISC Apply topically daily.    Marland Kitchen lisinopril-hydrochlorothiazide (ZESTORETIC) 10-12.5 MG tablet lisinopril 10 mg-hydrochlorothiazide 12.5 mg tablet    . OneTouch Delica Lancets 33G MISC Use 1 time daily    . ONETOUCH VERIO test strip daily.    . pravastatin (PRAVACHOL) 40 MG tablet Take 1 tablet by mouth at bedtime.    . sitaGLIPtin-metformin (JANUMET) 50-1000 MG tablet Take by mouth.    Marland Kitchen VITAMIN D PO Take 1 tablet by mouth daily.     Allergies Allergies  Allergen Reactions  . Poison Ivy Extract Rash    Family History Family History  Problem Relation Age of Onset  . Colon polyps Father 17  . Diabetes Maternal Grandmother   . Colon cancer Neg Hx   . Esophageal cancer Neg Hx   . Rectal cancer  Neg Hx   . Stomach cancer Neg Hx     Review of Systems: Constitutional:  no fevers Eye:  no recent significant change in vision Ears:  No changes in hearing Nose/Mouth/Throat:  no complaints of nasal congestion, no sore throat Cardiovascular: no chest pain Respiratory:  No shortness of breath Gastrointestinal:  No change in bowel habits GU:  No frequency Integumentary:  no abnormal skin lesions reported Neurologic:  no headaches Endocrine:  denies unexplained weight changes  Exam BP 130/64 (BP Location: Left Arm, Patient Position: Sitting, Cuff Size: Normal)   Pulse 83   Temp 98.8 F (37.1 C) (Oral)   Ht 6' (1.829 m)   Wt 291 lb 6 oz (132.2 kg)   SpO2 99%   BMI 39.52 kg/m  General:  well developed, well nourished, in no apparent distress Skin:  no significant moles, warts, or growths Head:  no masses, lesions, or tenderness Eyes:  pupils equal and round, sclera anicteric without injection Ears:  canals without lesions, TMs shiny without retraction, no obvious effusion, no erythema Nose:  nares patent, septum midline, mucosa normal Throat/Pharynx:  lips and gingiva without lesion; tongue and uvula midline; non-inflamed pharynx; no exudates or postnasal drainage Lungs:  clear to auscultation, breath  sounds equal bilaterally, no respiratory distress Cardio:  regular rate and rhythm, no LE edema or bruits Rectal: Deferred GI: BS+, S, NT, ND, no masses or organomegaly Musculoskeletal:  symmetrical muscle groups noted without atrophy or deformity Neuro:  gait normal; deep tendon reflexes normal and symmetric Psych: well oriented with normal range of affect and appropriate judgment/insight  Assessment and Plan  Well adult exam - Plan: CBC, Comprehensive metabolic panel, Lipid panel  Encounter for hepatitis C screening test for low risk patient - Plan: Hepatitis C antibody  Screening for HIV without presence of risk factors - Plan: HIV Antibody (routine testing w  rflx)  Screening for prostate cancer - Plan: PSA  Screening for AAA (abdominal aortic aneurysm) - Plan: US AORTA   Well 65 y.o. male. Counseled on diet and exercise. Other orders as above. Shingrix discussed, info given. Follow up in 1 yr or prn.  The patient voiced understanding and agreement to the plan.  Jilda Roche [place], DO 01/12/21 8:59 AM

## 2021-01-12 NOTE — Addendum Note (Signed)
Addended by: Scharlene Gloss B on: 01/12/2021 09:04 AM   Modules accepted: Orders

## 2021-01-13 LAB — HIV ANTIBODY (ROUTINE TESTING W REFLEX): HIV 1&2 Ab, 4th Generation: NONREACTIVE

## 2021-01-13 LAB — HEPATITIS C ANTIBODY
Hepatitis C Ab: NONREACTIVE
SIGNAL TO CUT-OFF: 0.02 (ref <1.00)

## 2021-01-21 ENCOUNTER — Other Ambulatory Visit: Payer: Self-pay

## 2021-01-21 ENCOUNTER — Ambulatory Visit (HOSPITAL_BASED_OUTPATIENT_CLINIC_OR_DEPARTMENT_OTHER)
Admission: RE | Admit: 2021-01-21 | Discharge: 2021-01-21 | Disposition: A | Discharge status: Home / Self Care | Payer: BC Managed Care – PPO | Source: Ambulatory Visit | Attending: Family Medicine | Admitting: Family Medicine

## 2021-01-21 DIAGNOSIS — Z136 Encounter for screening for cardiovascular disorders: Secondary | ICD-10-CM

## 2021-02-27 DIAGNOSIS — I10 Essential (primary) hypertension: Secondary | ICD-10-CM | POA: Diagnosis not present

## 2021-02-27 DIAGNOSIS — E119 Type 2 diabetes mellitus without complications: Secondary | ICD-10-CM | POA: Diagnosis not present

## 2021-02-27 DIAGNOSIS — Z794 Long term (current) use of insulin: Secondary | ICD-10-CM | POA: Diagnosis not present

## 2021-02-27 DIAGNOSIS — E782 Mixed hyperlipidemia: Secondary | ICD-10-CM | POA: Diagnosis not present

## 2021-04-15 ENCOUNTER — Encounter: Payer: Self-pay | Admitting: Family Medicine

## 2021-04-15 ENCOUNTER — Ambulatory Visit: Payer: BC Managed Care – PPO | Admitting: Family Medicine

## 2021-04-15 ENCOUNTER — Other Ambulatory Visit: Payer: Self-pay

## 2021-04-15 VITALS — BP 122/68 | HR 84 | Temp 98.2°F | Ht 72.0 in | Wt 275.0 lb

## 2021-04-15 DIAGNOSIS — M545 Low back pain, unspecified: Secondary | ICD-10-CM

## 2021-04-15 NOTE — Progress Notes (Signed)
Musculoskeletal Exam  Patient: Michael Howell DOB: 1955-11-03  DOS: 04/15/2021  SUBJECTIVE:  Chief Complaint:   Chief Complaint  Patient presents with   Back Pain    Right side back pain for 3 weeks    Michael Howell is a 65 y.o.  male for evaluation and treatment of back pain.   Onset:  3 weeks ago.  Was standing at his work bench and moved a little and felt a pull shortly after.  Location: lower right Character:  aching and dull  Progression of issue:  is unchanged Associated symptoms: Hurts w certain movements.  3/10 severity, worse in AM.  Denies bruising, redness, swelling, fevers, blood in urine.  Denies bowel/bladder incontinence or weakness Treatment: to date has been ice and heat.   Neurovascular symptoms: no  Past Medical History:  Diagnosis Date   Cataract    not a surgical candiate at this time (09/19/2020)   Diabetes mellitus    on meds   Hyperlipidemia    on meds   Hypertension    on meds   Sleep apnea    uses CPAP    Objective:  VITAL SIGNS: BP 122/68   Pulse 84   Temp 98.2 F (36.8 C) (Oral)   Ht 6' (1.829 m)   Wt 275 lb (124.7 kg)   SpO2 97%   BMI 37.30 kg/m  Constitutional: Well formed, well developed. No acute distress. HENT: Normocephalic, atraumatic.  Thorax & Lungs:  No accessory muscle use Musculoskeletal: low back.   Tenderness to palpation: yes over lateral R ES group Deformity: no Ecchymosis: no Straight leg test: negative for Poor hamstring flexibility b/l. Neurologic: Normal sensory function. No focal deficits noted. DTR's equal and symmetric in LE's. No clonus. Psychiatric: Normal mood. Age appropriate judgment and insight. Alert & oriented x 3.    Assessment:  Acute right-sided low back pain without sciatica  Plan: Stretches/exercises, heat, ice, Tylenol. PT if no better.  F/u prn. The patient voiced understanding and agreement to the plan.   Jilda Roche [place], DO 04/15/21  10:34 AM

## 2021-04-15 NOTE — Patient Instructions (Signed)

## 2021-06-25 ENCOUNTER — Encounter: Payer: Self-pay | Admitting: Family Medicine

## 2021-07-10 ENCOUNTER — Encounter: Payer: Self-pay | Admitting: Family Medicine

## 2021-07-20 ENCOUNTER — Encounter: Payer: Self-pay | Admitting: Family Medicine

## 2021-07-27 ENCOUNTER — Encounter: Payer: Self-pay | Admitting: Family Medicine

## 2021-07-28 MED ORDER — PRAVASTATIN SODIUM 40 MG PO TABS: 40.0000 mg | ORAL_TABLET | Freq: Every day | ORAL | 3 refills | Status: AC

## 2021-08-02 ENCOUNTER — Encounter: Payer: Self-pay | Admitting: Family Medicine

## 2021-08-03 ENCOUNTER — Encounter: Payer: Self-pay | Admitting: Family Medicine

## 2021-08-09 ENCOUNTER — Encounter: Payer: Self-pay | Admitting: Family Medicine

## 2021-08-11 DIAGNOSIS — Z794 Long term (current) use of insulin: Secondary | ICD-10-CM | POA: Diagnosis not present

## 2021-08-11 DIAGNOSIS — Z7984 Long term (current) use of oral hypoglycemic drugs: Secondary | ICD-10-CM | POA: Diagnosis not present

## 2021-08-11 DIAGNOSIS — E119 Type 2 diabetes mellitus without complications: Secondary | ICD-10-CM | POA: Diagnosis not present

## 2021-08-11 DIAGNOSIS — I10 Essential (primary) hypertension: Secondary | ICD-10-CM | POA: Diagnosis not present

## 2021-08-11 DIAGNOSIS — Z7985 Long-term (current) use of injectable non-insulin antidiabetic drugs: Secondary | ICD-10-CM | POA: Diagnosis not present

## 2021-08-11 DIAGNOSIS — E782 Mixed hyperlipidemia: Secondary | ICD-10-CM | POA: Diagnosis not present

## 2022-01-12 ENCOUNTER — Encounter: Payer: BC Managed Care – PPO | Admitting: Family Medicine

## 2022-01-28 DIAGNOSIS — I10 Essential (primary) hypertension: Secondary | ICD-10-CM | POA: Diagnosis not present

## 2022-01-28 DIAGNOSIS — E1136 Type 2 diabetes mellitus with diabetic cataract: Secondary | ICD-10-CM | POA: Diagnosis not present

## 2022-01-28 DIAGNOSIS — Z7985 Long-term (current) use of injectable non-insulin antidiabetic drugs: Secondary | ICD-10-CM | POA: Diagnosis not present

## 2022-01-28 DIAGNOSIS — E782 Mixed hyperlipidemia: Secondary | ICD-10-CM | POA: Diagnosis not present

## 2022-01-28 DIAGNOSIS — Z7984 Long term (current) use of oral hypoglycemic drugs: Secondary | ICD-10-CM | POA: Diagnosis not present

## 2022-02-08 ENCOUNTER — Encounter: Payer: Self-pay | Admitting: Family Medicine

## 2022-02-08 ENCOUNTER — Ambulatory Visit (INDEPENDENT_AMBULATORY_CARE_PROVIDER_SITE_OTHER): Payer: BC Managed Care – PPO | Admitting: Family Medicine

## 2022-02-08 VITALS — BP 110/74 | HR 64 | Temp 97.0°F | Ht 72.0 in | Wt 273.2 lb

## 2022-02-08 DIAGNOSIS — Z23 Encounter for immunization: Secondary | ICD-10-CM | POA: Diagnosis not present

## 2022-02-08 DIAGNOSIS — Z0000 Encounter for general adult medical examination without abnormal findings: Secondary | ICD-10-CM | POA: Diagnosis not present

## 2022-02-08 DIAGNOSIS — I10 Essential (primary) hypertension: Secondary | ICD-10-CM

## 2022-02-08 DIAGNOSIS — Z125 Encounter for screening for malignant neoplasm of prostate: Secondary | ICD-10-CM | POA: Diagnosis not present

## 2022-02-08 DIAGNOSIS — E782 Mixed hyperlipidemia: Secondary | ICD-10-CM

## 2022-02-08 LAB — CBC
HCT: 47.5 % (ref 39.0–52.0)
Hemoglobin: 16.1 g/dL (ref 13.0–17.0)
MCHC: 34 g/dL (ref 30.0–36.0)
MCV: 90.1 fl (ref 78.0–100.0)
Platelets: 295 10*3/uL (ref 150.0–400.0)
RBC: 5.27 Mil/uL (ref 4.22–5.81)
RDW: 13.7 % (ref 11.5–15.5)
WBC: 7.6 10*3/uL (ref 4.0–10.5)

## 2022-02-08 LAB — COMPREHENSIVE METABOLIC PANEL
ALT: 32 U/L (ref 0–53)
AST: 23 U/L (ref 0–37)
Albumin: 4.7 g/dL (ref 3.5–5.2)
Alkaline Phosphatase: 50 U/L (ref 39–117)
BUN: 17 mg/dL (ref 6–23)
CO2: 27 mEq/L (ref 19–32)
Calcium: 9.8 mg/dL (ref 8.4–10.5)
Chloride: 99 mEq/L (ref 96–112)
Creatinine, Ser: 0.95 mg/dL (ref 0.40–1.50)
GFR: 83.45 mL/min (ref >60.00)
Glucose, Bld: 172 mg/dL — ABNORMAL HIGH (ref 70–99)
Potassium: 4.4 mEq/L (ref 3.5–5.1)
Sodium: 137 mEq/L (ref 135–145)
Total Bilirubin: 0.6 mg/dL (ref 0.2–1.2)
Total Protein: 7.1 g/dL (ref 6.0–8.3)

## 2022-02-08 LAB — LIPID PANEL
Cholesterol: 180 mg/dL (ref 0–200)
HDL: 39.9 mg/dL (ref >39.00)
LDL Cholesterol: 102 mg/dL — ABNORMAL HIGH (ref 0–99)
NonHDL: 140.23
Total CHOL/HDL Ratio: 5
Triglycerides: 192 mg/dL — ABNORMAL HIGH (ref 0.0–149.0)
VLDL: 38.4 mg/dL (ref 0.0–40.0)

## 2022-02-08 LAB — PSA: PSA: 0.76 ng/mL (ref 0.10–4.00)

## 2022-02-08 NOTE — Progress Notes (Signed)
Chief Complaint  Patient presents with   Annual Exam    Well Male Michael Howell is here for a complete physical.   His last physical was >1 year ago.  Current diet: in general, a "pretty healthy" diet.   Current exercise: walking, active in yard Weight trend: intentionally losing (started on Ozempic) Fatigue out of ordinary? No. Seat belt? Yes.   Advanced directive? No, in process of setting up  Health maintenance Shingrix- No Colonoscopy- Yes Tetanus- Due Hep C- Yes Pneumonia vaccine- Due for PCV20  Past Medical History:  Diagnosis Date   Cataract    not a surgical candiate at this time (09/19/2020)   Diabetes mellitus    on meds   Hyperlipidemia    on meds   Hypertension    on meds   Sleep apnea    uses CPAP     Past Surgical History:  Procedure Laterality Date   BACK SURGERY  1988   lumbar spine (L5) ruptured disc repaired    Medications  Current Outpatient Medications on File Prior to Visit  Medication Sig Dispense Refill   aspirin 81 MG chewable tablet Chew 1 tablet by mouth daily.     glucose blood (ONETOUCH VERIO) test strip Use 1 times daily     ibuprofen (ADVIL) 600 MG tablet daily as needed.     lisinopril-hydrochlorothiazide (ZESTORETIC) 10-12.5 MG tablet lisinopril 10 mg-hydrochlorothiazide 12.5 mg tablet     OneTouch Delica Lancets 33G MISC Use 1 time daily     OZEMPIC, 0.25 OR 0.5 MG/DOSE, 2 MG/3ML SOPN SMARTSIG:0.5 Milligram(s) Topical Once a Week     pravastatin (PRAVACHOL) 40 MG tablet Take 1 tablet (40 mg total) by mouth at bedtime. 90 tablet 3   SYNJARDY XR 12.12-998 MG TB24 Take 2 tablets by mouth daily.     VITAMIN D PO Take 1 tablet by mouth daily.     Allergies Allergies  Allergen Reactions   Poison Ivy Extract Rash    Family History Family History  Problem Relation Age of Onset   Colon polyps Father 60   Diabetes Maternal Grandmother    Colon cancer Neg Hx    Esophageal cancer Neg Hx    Rectal cancer Neg Hx    Stomach cancer  Neg Hx     Review of Systems: Constitutional:  no fevers Eye:  no recent significant change in vision Ears:  No changes in hearing Nose/Mouth/Throat:  no complaints of nasal congestion, no sore throat Cardiovascular: no chest pain Respiratory:  No shortness of breath Gastrointestinal:  No change in bowel habits GU:  No frequency Integumentary:  no abnormal skin lesions reported Neurologic:  no headaches Endocrine:  denies unexplained weight changes  Exam BP 110/74   Pulse 64   Temp (!) 97 F (36.1 C) (Oral)   Ht 6' (1.829 m)   Wt 273 lb 4 oz (123.9 kg)   SpO2 98%   BMI 37.06 kg/m  General:  well developed, well nourished, in no apparent distress Skin:  no significant moles, warts, or growths Head:  no masses, lesions, or tenderness Eyes:  pupils equal and round, sclera anicteric without injection Ears:  canals without lesions, TMs shiny without retraction, no obvious effusion, no erythema Nose:  nares patent, septum midline, mucosa normal Throat/Pharynx:  lips and gingiva without lesion; tongue and uvula midline; non-inflamed pharynx; no exudates or postnasal drainage Lungs:  clear to auscultation, breath sounds equal bilaterally, no respiratory distress Cardio:  regular rate and rhythm, no LE  edema or bruits Rectal: Deferred GI: BS+, S, NT, ND, no masses or organomegaly Musculoskeletal:  symmetrical muscle groups noted without atrophy or deformity Neuro:  gait normal; deep tendon reflexes normal and symmetric Psych: well oriented with normal range of affect and appropriate judgment/insight  Assessment and Plan  Well adult exam - Plan: Comprehensive metabolic panel, Lipid panel, CBC  Screening for prostate cancer - Plan: PSA  Mixed hyperlipidemia  Essential hypertension   Well 66 y.o. male. Counseled on diet and exercise. Discussed risks and benefits of PSA testing, he agrees to undergo testing.  Advanced directive form requested today.  Tdap and PCV20 today.   Shingrix rec'd. He plans to get.  Other orders as above. Follow up in 1 yr or prn.  The patient voiced understanding and agreement to the plan.  Jilda Roche [place], DO 02/08/22 8:19 AM

## 2022-02-08 NOTE — Patient Instructions (Addendum)
Give [place] 2-3 business days to get the results of your labs back.   Keep the diet clean and stay active.  Please get me a copy of your advanced directive form at your convenience.   The Shingrix vaccine (for shingles) is a 2 shot series spaced 2-6 months apart. It can make people feel low energy, achy and almost like they have the flu for 48 hours after injection. 1/5 people can have nausea and/or vomiting. Please plan accordingly when deciding on when to get this shot. Call our office for a nurse visit appointment to get this. The second shot of the series is less severe regarding the side effects, but it still lasts 48 hours.   Monitor your blood pressure at home as you continue to lose weight. If the top number is consistently <105 or you have symptoms, let either me or your endocrinologist know.   Let us know if you need anything.

## 2022-02-08 NOTE — Addendum Note (Signed)
Addended by: Scharlene Gloss B on: 02/08/2022 08:26 AM   Modules accepted: Orders

## 2022-02-16 IMAGING — CT CT ANGIO CHEST
2 of 8 series · 18 of 36 positions shown · IV contrast (Omnipaque)
Comparison: None.

CLINICAL DATA: PE suspected. Shortness of breath.

EXAM:
CT ANGIOGRAPHY CHEST WITH CONTRAST
TECHNIQUE: Multidetector CT imaging of the chest was performed using the
standard protocol during bolus administration of intravenous
contrast. Multiplanar CT image reconstructions and MIPs were
obtained to evaluate the vascular anatomy.
CONTRAST:  100mL OMNIPAQUE IOHEXOL 350 MG/ML SOLN

[Series 6: pe coronal mpr · coronal · 0.60mm/px · 1 of 89 slices shown]
[im 45/89  mediastinal]
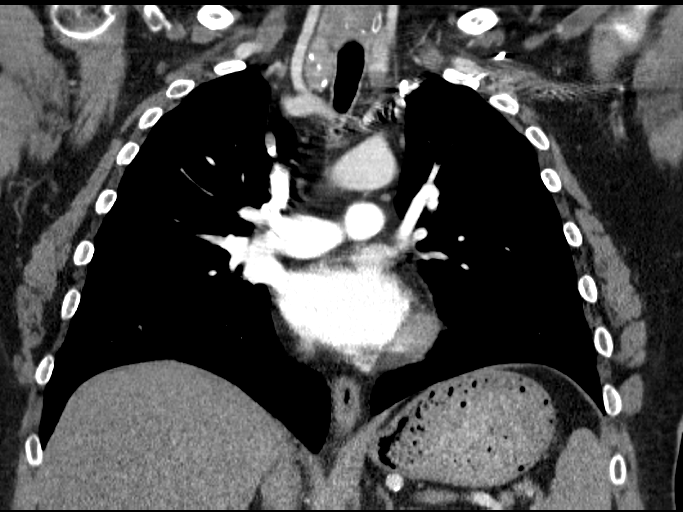

[Series 10: pe thins · axial · 0.85mm/px · z∈[-300,-30]mm · 17 of 401 slices shown]
[im 21/401  lung]
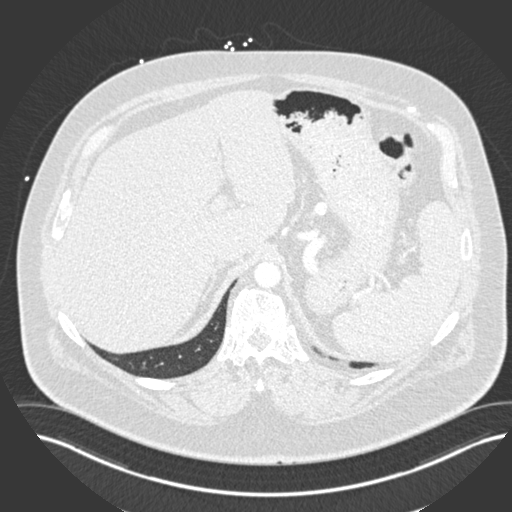
[im 41/401  mediastinal]
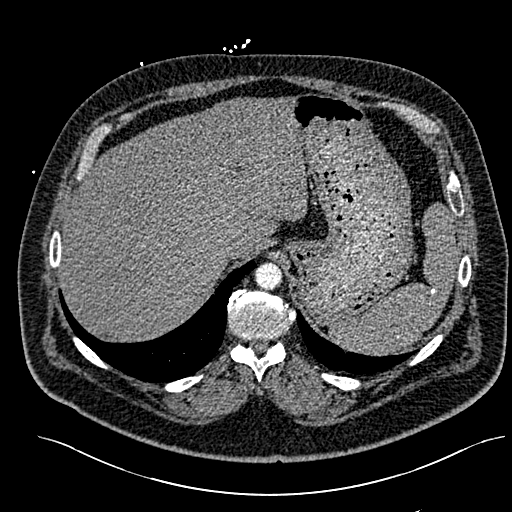
[im 61/401  lung]
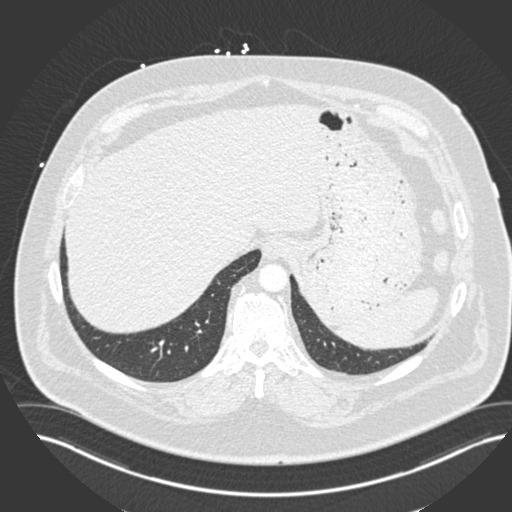
[im 81/401  mediastinal]
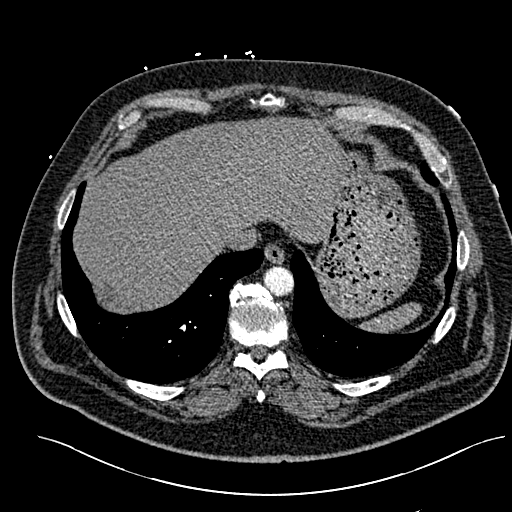
[im 121/401  lung]
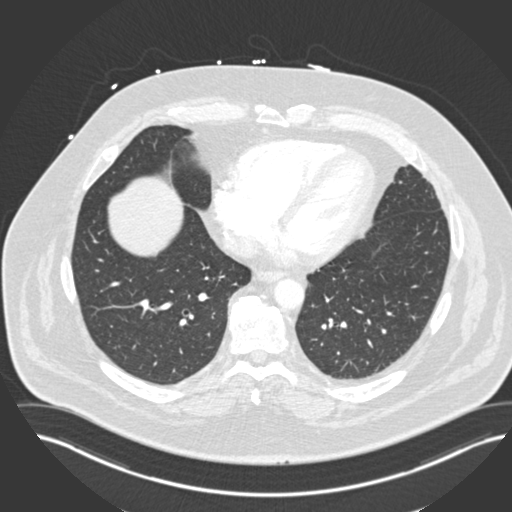
[im 141/401  mediastinal]
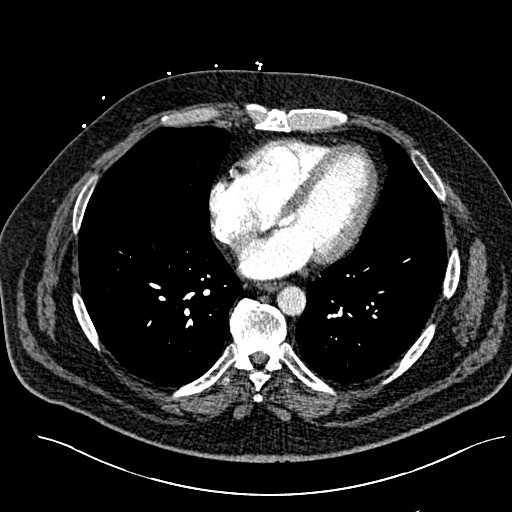
[im 161/401  lung]
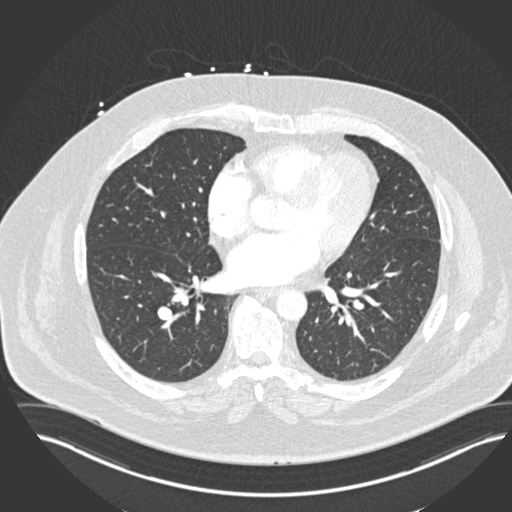
[im 181/401  mediastinal]
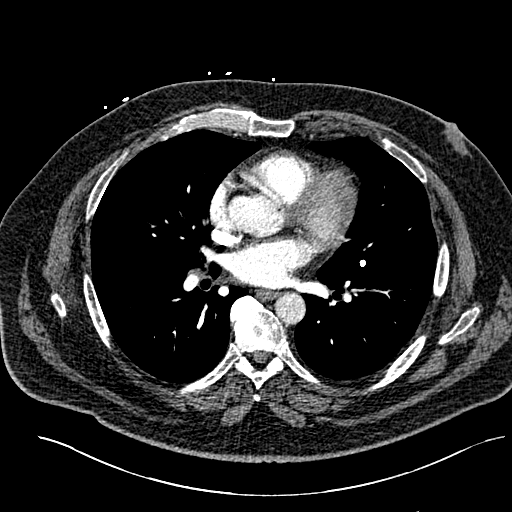
[im 201/401  lung]
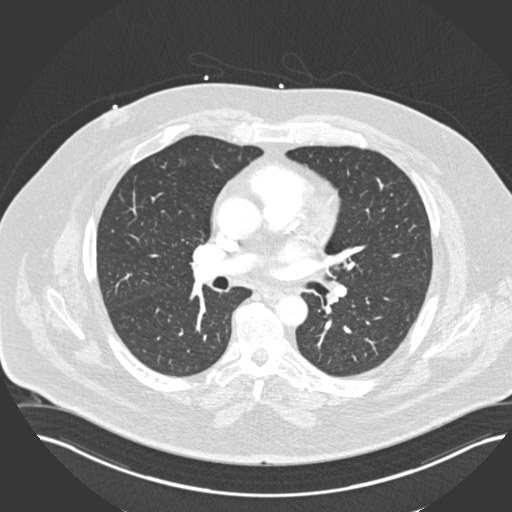
[im 221/401  mediastinal]
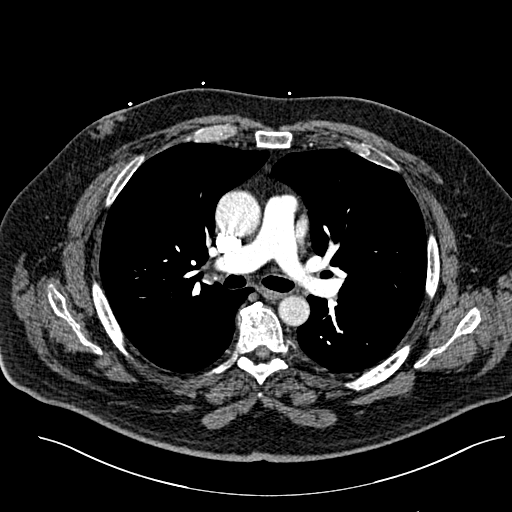
[im 241/401  lung]
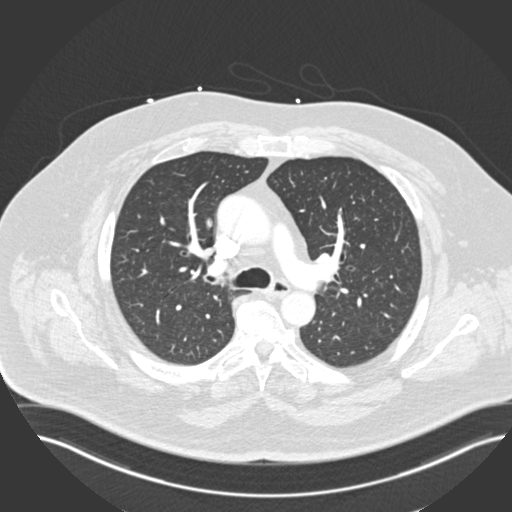
[im 261/401  mediastinal]
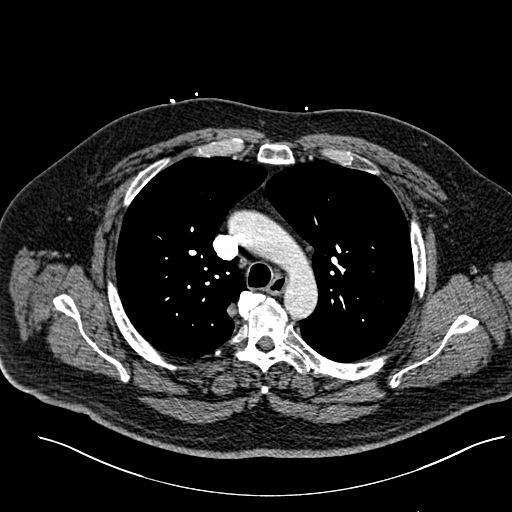
[im 281/401  lung]
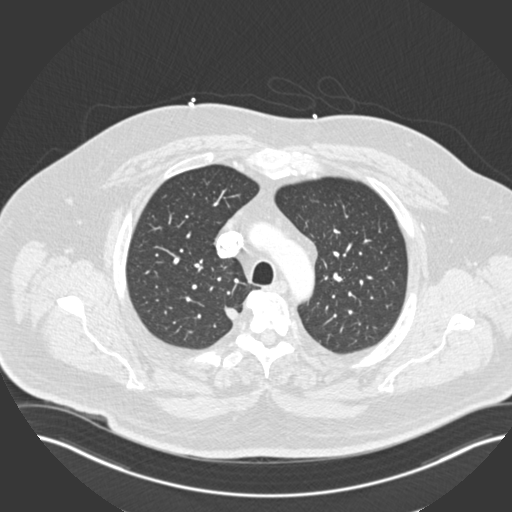
[im 321/401  mediastinal]
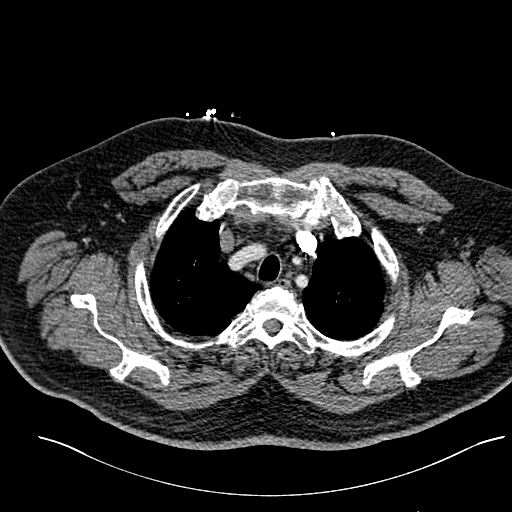
[im 341/401  lung]
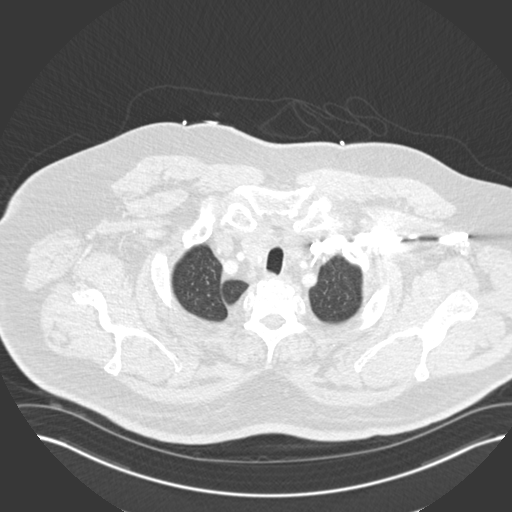
[im 361/401  mediastinal]
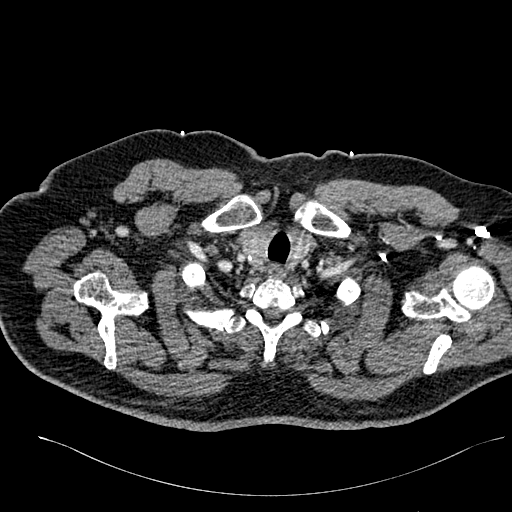
[im 381/401  lung]
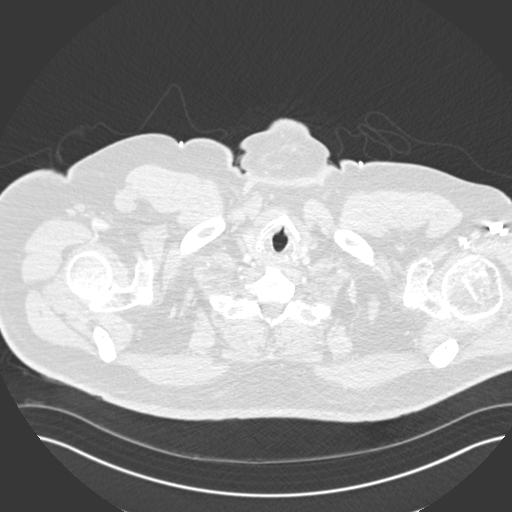

[18 of 36 positions shown; findings below may reference images not displayed]

FINDINGS: Cardiovascular: Contrast injection is sufficient to demonstrate
satisfactory opacification of the pulmonary arteries to the
segmental level. There is no pulmonary embolus. The main pulmonary
artery is within normal limits for size. There is no CT evidence of
acute right heart strain. The visualized aorta is normal. Heart size
is normal. There are coronary artery calcifications.

Mediastinum/Nodes:

--there are calcified hilar and mediastinal lymph nodes on the right
consistent prior granulomatous infection.

--No axillary lymphadenopathy.

--No supraclavicular lymphadenopathy.

--there is a calcified right-sided thyroid nodule that appears to be
measuring at least 2.4 cm.

--The esophagus is unremarkable

Lungs/Pleura: There is an azygos lobe, a normal variant. There are
calcified pulmonary nodules primarily within the right lower lobe
consistent with a prior granulomatous infection. The lungs are
otherwise clear without evidence for pneumothorax or significant
pleural effusion.

Upper Abdomen: There are innumerable calcifications in the patient's
spleen consistent with a prior granulomatous infection. There is no
acute abnormality in the upper abdomen.

Musculoskeletal: No chest wall abnormality. No acute or significant
osseous findings. There is an area of sclerosis involving the right
scapula which is felt to represent a bone island. This is
essentially stable since 1508.

Review of the MIP images confirms the above findings.
IMPRESSION: 1. No evidence of acute pulmonary embolism.
2. Coronary artery disease.
3. Sequelae of prior granulomatous infection involving the lungs and
spleen.
4. Right-sided calcified thyroid nodule that appears to be measuring
at least 2.4 cm. Outpatient thyroid ultrasound is recommended for
further evaluation of this finding.(Ref: [HOSPITAL]. 1508

## 2022-02-24 DIAGNOSIS — E119 Type 2 diabetes mellitus without complications: Secondary | ICD-10-CM | POA: Diagnosis not present

## 2022-02-24 DIAGNOSIS — H2513 Age-related nuclear cataract, bilateral: Secondary | ICD-10-CM | POA: Diagnosis not present

## 2022-02-24 DIAGNOSIS — H25013 Cortical age-related cataract, bilateral: Secondary | ICD-10-CM | POA: Diagnosis not present

## 2022-02-24 DIAGNOSIS — H35372 Puckering of macula, left eye: Secondary | ICD-10-CM | POA: Diagnosis not present

## 2022-03-04 ENCOUNTER — Encounter: Payer: Self-pay | Admitting: Family Medicine

## 2022-03-05 ENCOUNTER — Encounter: Payer: Self-pay | Admitting: Family Medicine

## 2022-03-05 ENCOUNTER — Other Ambulatory Visit: Payer: Self-pay | Admitting: Family Medicine

## 2022-03-05 DIAGNOSIS — E782 Mixed hyperlipidemia: Secondary | ICD-10-CM

## 2022-03-05 MED ORDER — FENOFIBRATE 48 MG PO TABS: 48.0000 mg | ORAL_TABLET | Freq: Every day | ORAL | 3 refills | Status: DC

## 2022-03-09 ENCOUNTER — Encounter: Payer: Self-pay | Admitting: Family Medicine

## 2022-03-09 ENCOUNTER — Ambulatory Visit: Payer: BC Managed Care – PPO | Admitting: Family Medicine

## 2022-03-09 VITALS — BP 115/70 | HR 82 | Temp 98.4°F | Ht 72.0 in | Wt 263.1 lb

## 2022-03-09 DIAGNOSIS — H9311 Tinnitus, right ear: Secondary | ICD-10-CM

## 2022-03-09 DIAGNOSIS — F411 Generalized anxiety disorder: Secondary | ICD-10-CM | POA: Diagnosis not present

## 2022-03-09 MED ORDER — LORAZEPAM 0.5 MG PO TABS: 0.5000 mg | ORAL_TABLET | Freq: Two times a day (BID) | ORAL | 1 refills | Status: DC | PRN

## 2022-03-09 MED ORDER — CITALOPRAM HYDROBROMIDE 20 MG PO TABS: ORAL_TABLET | ORAL | 3 refills | Status: DC

## 2022-03-09 NOTE — Progress Notes (Signed)
Chief Complaint  Patient presents with   Tinnitus    Panic attacks Not sleeping    Subjective Michael Howell is an 66 y.o. male who presents with anxiety/depression Symptoms began a couple weeks ago where they flared. It had happened in the past around 6 years ago.  Anxiety symptoms: difficulty concentrating, insomnia, irritable, racing thoughts. Not having sig depressive s/s's.  Family history significant for no psychiatric illness. Social stressors include wife's health.  He is currently being treated with None and has failed None. Was on lorazepam when he lost job 6 yrs ago and it worked well for a temporary period of time.  He is not following with a psychologist.  Past Medical History:  Diagnosis Date   Cataract    not a surgical candiate at this time (09/19/2020)   Diabetes mellitus    on meds   Hyperlipidemia    on meds   Hypertension    on meds   Sleep apnea    uses CPAP     Family History Family History  Problem Relation Age of Onset   Colon polyps Father 23   Diabetes Maternal Grandmother    Colon cancer Neg Hx    Esophageal cancer Neg Hx    Rectal cancer Neg Hx    Stomach cancer Neg Hx     Exam BP 115/70   Pulse 82   Temp 98.4 F (36.9 C) (Oral)   Ht 6' (1.829 m)   Wt 263 lb 2 oz (119.4 kg)   SpO2 97%   BMI 35.69 kg/m  General:  well developed, well nourished, in no apparent distress Ears: Neg b/l Heart: RRR Lungs: CTAB, normal respiratory effort without accessory muscle use Psych: well oriented with normal range of affect and age-appropriate judgement/insight  Assessment and Plan  GAD (generalized anxiety disorder) - Plan: citalopram (CELEXA) 20 MG tablet, LORazepam (ATIVAN) 0.5 MG tablet  Tinnitus of right ear  Recurrence of chronic issue, uncontrolled. Start Celexa 10 mg/d for 2 weeks and then increase to 20 mg/d. Counseling info provided. Ativan prn. Warnings about this verbalized. Counseled on exercise. This may improve with his wife's  upcoming appt w a specialist this week.  F/u in 1 mo. Patient voiced understanding and agreement to the plan.  Jilda Roche [place], DO 03/09/22 8:55 AM

## 2022-03-09 NOTE — Patient Instructions (Addendum)
Please consider using a white noise machine at night.  Please consider counseling. Contact [phone] to schedule an appointment or inquire about cost/insurance coverage.  [facility] located at [address], [address], [place], [place].  Phone number = [phone].  Dr. Regan Lemming - Adult Psychiatry.    [facility] located at [address] [place], [place], [place]. Phone number = [phone].   [facility] located at [address] [place], [place].  Phone number = [phone].   [facility] located at [address] [place], [place], [place].  Phone number = [phone].  Coping skills Choose 5 that work for you: Take a deep breath Count to 20 Read a book Do a puzzle Meditate Bake Sing Knit Garden Pray Go outside Call a friend Listen to music Take a walk Color Send a note Take a bath Watch a movie Be alone in a quiet place Pet an animal Visit a friend Journal Exercise Stretch   Let us know if you need anything.

## 2022-03-28 ENCOUNTER — Encounter: Payer: Self-pay | Admitting: Family Medicine

## 2022-03-29 ENCOUNTER — Encounter: Payer: Self-pay | Admitting: Family Medicine

## 2022-03-29 ENCOUNTER — Telehealth: Payer: BC Managed Care – PPO | Admitting: Family Medicine

## 2022-03-29 DIAGNOSIS — U071 COVID-19: Secondary | ICD-10-CM | POA: Diagnosis not present

## 2022-03-29 MED ORDER — MOLNUPIRAVIR EUA 200MG CAPSULE: 4.0000 | ORAL_CAPSULE | Freq: Two times a day (BID) | ORAL | 0 refills | Status: AC

## 2022-03-29 NOTE — Progress Notes (Signed)
Chief Complaint  Patient presents with   Covid Positive    Michael Howell here for URI complaints. Due to COVID-19 pandemic, we are interacting via web portal for an electronic face-to-face visit. I verified patient's ID using 2 identifiers. Patient agreed to proceed with visit via this method. Patient is at home, I am at office. Patient and I are present for visit.   Duration: 3 days  Associated symptoms: sinus headache, sinus congestion, rhinorrhea, sore throat, and coughing Denies: sinus pain, itchy watery eyes, ear pain, ear drainage, wheezing, shortness of breath, myalgia, and fevers, N/V/D, loss of taste/smell Treatment to date: Tylenol, Xycam, Zinc Sick contacts: No Tested + for covid on 8/19  Past Medical History:  Diagnosis Date   Cataract    not a surgical candiate at this time (09/19/2020)   Diabetes mellitus    on meds   Hyperlipidemia    on meds   Hypertension    on meds   Sleep apnea    uses CPAP    Objective No conversational dyspnea Age appropriate judgment and insight Nml affect and mood  COVID-19 - Plan: molnupiravir EUA (LAGEVRIO) 200 mg CAPS capsule  Given age, will treat with 5 days of molnupiravir.  Continue to push fluids, practice good hand hygiene, cover mouth when coughing.  We discussed CDC quarantining guidelines. F/u prn. If starting to experience irreplaceable fluid loss, shaking, or shortness of breath, seek immediate care. Pt voiced understanding and agreement to the plan.  Jilda Roche [place], DO 03/29/22 2:25 PM

## 2022-04-02 ENCOUNTER — Ambulatory Visit: Payer: BC Managed Care – PPO | Admitting: Family Medicine

## 2022-04-16 ENCOUNTER — Other Ambulatory Visit (INDEPENDENT_AMBULATORY_CARE_PROVIDER_SITE_OTHER): Payer: BC Managed Care – PPO

## 2022-04-16 ENCOUNTER — Other Ambulatory Visit: Payer: Self-pay | Admitting: Family Medicine

## 2022-04-16 ENCOUNTER — Encounter: Payer: Self-pay | Admitting: Family Medicine

## 2022-04-16 DIAGNOSIS — E782 Mixed hyperlipidemia: Secondary | ICD-10-CM | POA: Diagnosis not present

## 2022-04-16 LAB — LIPID PANEL
Cholesterol: 158 mg/dL (ref 0–200)
HDL: 37.9 mg/dL — ABNORMAL LOW (ref >39.00)
LDL Cholesterol: 83 mg/dL (ref 0–99)
NonHDL: 119.62
Total CHOL/HDL Ratio: 4
Triglycerides: 181 mg/dL — ABNORMAL HIGH (ref 0.0–149.0)
VLDL: 36.2 mg/dL (ref 0.0–40.0)

## 2022-04-16 LAB — HEPATIC FUNCTION PANEL
ALT: 34 U/L (ref 0–53)
AST: 23 U/L (ref 0–37)
Albumin: 4.4 g/dL (ref 3.5–5.2)
Alkaline Phosphatase: 52 U/L (ref 39–117)
Bilirubin, Direct: 0.1 mg/dL (ref 0.0–0.3)
Total Bilirubin: 0.6 mg/dL (ref 0.2–1.2)
Total Protein: 7 g/dL (ref 6.0–8.3)

## 2022-04-16 MED ORDER — FENOFIBRATE 145 MG PO TABS: 145.0000 mg | ORAL_TABLET | Freq: Every day | ORAL | 2 refills | Status: DC

## 2022-05-06 DIAGNOSIS — E119 Type 2 diabetes mellitus without complications: Secondary | ICD-10-CM | POA: Diagnosis not present

## 2022-05-06 DIAGNOSIS — H40031 Anatomical narrow angle, right eye: Secondary | ICD-10-CM | POA: Diagnosis not present

## 2022-05-06 DIAGNOSIS — H2513 Age-related nuclear cataract, bilateral: Secondary | ICD-10-CM | POA: Diagnosis not present

## 2022-05-06 DIAGNOSIS — H2511 Age-related nuclear cataract, right eye: Secondary | ICD-10-CM | POA: Diagnosis not present

## 2022-05-06 DIAGNOSIS — I10 Essential (primary) hypertension: Secondary | ICD-10-CM | POA: Diagnosis not present

## 2022-05-06 DIAGNOSIS — H40033 Anatomical narrow angle, bilateral: Secondary | ICD-10-CM | POA: Diagnosis not present

## 2022-05-13 DIAGNOSIS — H2511 Age-related nuclear cataract, right eye: Secondary | ICD-10-CM | POA: Diagnosis not present

## 2022-05-13 DIAGNOSIS — H40033 Anatomical narrow angle, bilateral: Secondary | ICD-10-CM | POA: Diagnosis not present

## 2022-05-13 DIAGNOSIS — H25013 Cortical age-related cataract, bilateral: Secondary | ICD-10-CM | POA: Diagnosis not present

## 2022-05-20 DIAGNOSIS — H40032 Anatomical narrow angle, left eye: Secondary | ICD-10-CM | POA: Diagnosis not present

## 2022-05-27 DIAGNOSIS — H2511 Age-related nuclear cataract, right eye: Secondary | ICD-10-CM | POA: Diagnosis not present

## 2022-05-27 DIAGNOSIS — H40033 Anatomical narrow angle, bilateral: Secondary | ICD-10-CM | POA: Diagnosis not present

## 2022-05-27 DIAGNOSIS — H25013 Cortical age-related cataract, bilateral: Secondary | ICD-10-CM | POA: Diagnosis not present

## 2022-05-31 DIAGNOSIS — M5432 Sciatica, left side: Secondary | ICD-10-CM | POA: Diagnosis not present

## 2022-06-03 DIAGNOSIS — M5432 Sciatica, left side: Secondary | ICD-10-CM | POA: Diagnosis not present

## 2022-07-21 ENCOUNTER — Other Ambulatory Visit: Payer: Self-pay | Admitting: Family Medicine

## 2022-07-21 DIAGNOSIS — H2511 Age-related nuclear cataract, right eye: Secondary | ICD-10-CM | POA: Diagnosis not present

## 2022-07-22 DIAGNOSIS — H2512 Age-related nuclear cataract, left eye: Secondary | ICD-10-CM | POA: Diagnosis not present

## 2022-08-04 DIAGNOSIS — H2512 Age-related nuclear cataract, left eye: Secondary | ICD-10-CM | POA: Diagnosis not present

## 2022-08-10 ENCOUNTER — Telehealth: Payer: Self-pay | Admitting: Family Medicine

## 2022-08-10 NOTE — Telephone Encounter (Signed)
Patient called to schedule labs to check how fenofibrate is working. Please make sure hepatic panel order is still valid/correct for lab appt scheduled on 08/13/22.

## 2022-08-10 NOTE — Telephone Encounter (Signed)
Labs in chart for appt.

## 2022-08-12 ENCOUNTER — Encounter: Payer: Self-pay | Admitting: Family Medicine

## 2022-08-12 DIAGNOSIS — I10 Essential (primary) hypertension: Secondary | ICD-10-CM | POA: Diagnosis not present

## 2022-08-12 DIAGNOSIS — E782 Mixed hyperlipidemia: Secondary | ICD-10-CM | POA: Diagnosis not present

## 2022-08-12 DIAGNOSIS — Z794 Long term (current) use of insulin: Secondary | ICD-10-CM | POA: Diagnosis not present

## 2022-08-12 DIAGNOSIS — Z7984 Long term (current) use of oral hypoglycemic drugs: Secondary | ICD-10-CM | POA: Diagnosis not present

## 2022-08-12 DIAGNOSIS — E119 Type 2 diabetes mellitus without complications: Secondary | ICD-10-CM | POA: Diagnosis not present

## 2022-08-13 ENCOUNTER — Other Ambulatory Visit: Payer: BC Managed Care – PPO

## 2022-09-06 ENCOUNTER — Encounter: Payer: Self-pay | Admitting: Family Medicine

## 2022-10-04 ENCOUNTER — Encounter: Payer: Self-pay | Admitting: Family Medicine

## 2022-10-26 ENCOUNTER — Other Ambulatory Visit: Payer: Self-pay | Admitting: Family Medicine

## 2022-11-09 ENCOUNTER — Encounter: Payer: Self-pay | Admitting: Family Medicine

## 2022-11-30 ENCOUNTER — Encounter: Payer: Self-pay | Admitting: Family Medicine

## 2022-12-23 ENCOUNTER — Encounter: Payer: Self-pay | Admitting: Family Medicine

## 2023-01-27 ENCOUNTER — Other Ambulatory Visit: Payer: Self-pay | Admitting: Family Medicine

## 2023-01-31 ENCOUNTER — Ambulatory Visit (INDEPENDENT_AMBULATORY_CARE_PROVIDER_SITE_OTHER): Payer: BC Managed Care – PPO | Admitting: Family Medicine

## 2023-01-31 ENCOUNTER — Encounter: Payer: Self-pay | Admitting: Family Medicine

## 2023-01-31 VITALS — BP 120/70 | HR 79 | Temp 98.2°F | Ht 72.0 in | Wt 265.2 lb

## 2023-01-31 DIAGNOSIS — Z125 Encounter for screening for malignant neoplasm of prostate: Secondary | ICD-10-CM | POA: Diagnosis not present

## 2023-01-31 DIAGNOSIS — Z0000 Encounter for general adult medical examination without abnormal findings: Secondary | ICD-10-CM

## 2023-01-31 DIAGNOSIS — H9193 Unspecified hearing loss, bilateral: Secondary | ICD-10-CM

## 2023-01-31 DIAGNOSIS — E782 Mixed hyperlipidemia: Secondary | ICD-10-CM

## 2023-01-31 LAB — COMPREHENSIVE METABOLIC PANEL
ALT: 26 U/L (ref 0–53)
AST: 25 U/L (ref 0–37)
Albumin: 4.8 g/dL (ref 3.5–5.2)
Alkaline Phosphatase: 42 U/L (ref 39–117)
BUN: 20 mg/dL (ref 6–23)
CO2: 25 mEq/L (ref 19–32)
Calcium: 10.3 mg/dL (ref 8.4–10.5)
Chloride: 98 mEq/L (ref 96–112)
Creatinine, Ser: 1.11 mg/dL (ref 0.40–1.50)
GFR: 68.76 mL/min (ref >60.00)
Glucose, Bld: 128 mg/dL — ABNORMAL HIGH (ref 70–99)
Potassium: 4.1 mEq/L (ref 3.5–5.1)
Sodium: 134 mEq/L — ABNORMAL LOW (ref 135–145)
Total Bilirubin: 0.9 mg/dL (ref 0.2–1.2)
Total Protein: 7.5 g/dL (ref 6.0–8.3)

## 2023-01-31 LAB — LIPID PANEL
Cholesterol: 184 mg/dL (ref 0–200)
HDL: 40.7 mg/dL (ref >39.00)
LDL Cholesterol: 103 mg/dL — ABNORMAL HIGH (ref 0–99)
NonHDL: 143.31
Total CHOL/HDL Ratio: 5
Triglycerides: 200 mg/dL — ABNORMAL HIGH (ref 0.0–149.0)
VLDL: 40 mg/dL (ref 0.0–40.0)

## 2023-01-31 LAB — PSA: PSA: 1.28 ng/mL (ref 0.10–4.00)

## 2023-01-31 NOTE — Patient Instructions (Signed)
Give us 2-3 business days to get the results of your labs back.   Keep the diet clean and stay active.  Aim to do some physical exertion for 150 minutes per week. This is typically divided into 5 days per week, 30 minutes per day. The activity should be enough to get your heart rate up. Anything is better than nothing if you have time constraints.  Let us know if you need anything.  

## 2023-01-31 NOTE — Progress Notes (Signed)
Chief Complaint  Patient presents with   Annual Exam    Well Male Michael Howell is here for a complete physical.   His last physical was last year.  Current diet: in general, a "healthy" diet.   Current exercise: walking, active in yard, wt resistance exercise Weight trend: stable Fatigue out of ordinary? No. Seat belt? Yes.   Advanced directive? Yes  Health maintenance Shingrix- Yes Colonoscopy- Yes Tetanus- Yes Hep C- Yes Pneumonia vaccine- Yes  Past Medical History:  Diagnosis Date   Cataract    not a surgical candiate at this time (09/19/2020)   Diabetes mellitus    on meds   Hyperlipidemia    on meds   Hypertension    on meds   Sleep apnea    uses CPAP     Past Surgical History:  Procedure Laterality Date   BACK SURGERY  1988   lumbar spine (L5) ruptured disc repaired    Medications  Current Outpatient Medications on File Prior to Visit  Medication Sig Dispense Refill   aspirin 81 MG chewable tablet Chew 1 tablet by mouth daily.     citalopram (CELEXA) 20 MG tablet Take 1/2 tab daily for 2 weeks and then increase to 1 tab daily. 30 tablet 3   fenofibrate (TRICOR) 145 MG tablet Take 1 tablet (145 mg total) by mouth daily. 90 tablet 0   glucose blood (ONETOUCH VERIO) test strip Use 1 times daily     ibuprofen (ADVIL) 600 MG tablet daily as needed.     lisinopril-hydrochlorothiazide (ZESTORETIC) 10-12.5 MG tablet lisinopril 10 mg-hydrochlorothiazide 12.5 mg tablet     OneTouch Delica Lancets 33G MISC Use 1 time daily     OZEMPIC, 0.25 OR 0.5 MG/DOSE, 2 MG/3ML SOPN SMARTSIG:0.5 Milligram(s) Topical Once a Week     pravastatin (PRAVACHOL) 40 MG tablet Take 1 tablet (40 mg total) by mouth at bedtime. 90 tablet 3   SYNJARDY XR 12.12-998 MG TB24 Take 2 tablets by mouth daily.     VITAMIN D PO Take 1 tablet by mouth daily.     No current facility-administered medications on file prior to visit.     Allergies Allergies  Allergen Reactions   Poison Ivy Extract  Rash    Family History Family History  Problem Relation Age of Onset   Colon polyps Father 75   Diabetes Maternal Grandmother    Colon cancer Neg Hx    Esophageal cancer Neg Hx    Rectal cancer Neg Hx    Stomach cancer Neg Hx     Review of Systems: Constitutional:  no fevers Eye:  no recent significant change in vision Ears:  No changes in hearing Nose/Mouth/Throat:  no complaints of nasal congestion, no sore throat Cardiovascular: no chest pain Respiratory:  No shortness of breath Gastrointestinal:  No change in bowel habits GU:  No frequency Integumentary:  no abnormal skin lesions reported Neurologic:  no headaches Endocrine:  denies unexplained weight changes  Exam BP 120/70 (BP Location: Left Arm, Patient Position: Sitting, Cuff Size: Large)   Pulse 79   Temp 98.2 F (36.8 C) (Oral)   Ht 6' (1.829 m)   Wt 265 lb 4 oz (120.3 kg)   SpO2 97%   BMI 35.97 kg/m  General:  well developed, well nourished, in no apparent distress Skin:  no significant moles, warts, or growths Head:  no masses, lesions, or tenderness Eyes:  pupils equal and round, sclera anicteric without injection Ears:  canals without lesions,  TMs shiny without retraction, no obvious effusion, no erythema Nose:  nares patent, mucosa normal Throat/Pharynx:  lips and gingiva without lesion; tongue and uvula midline; non-inflamed pharynx; no exudates or postnasal drainage Lungs:  clear to auscultation, breath sounds equal bilaterally, no respiratory distress Cardio:  regular rate and rhythm, no LE edema or bruits Rectal: Deferred GI: BS+, S, NT, ND, no masses or organomegaly Musculoskeletal:  symmetrical muscle groups noted without atrophy or deformity Neuro:  gait normal; deep tendon reflexes normal and symmetric Psych: well oriented with normal range of affect and appropriate judgment/insight  Assessment and Plan  Well adult exam  Bilateral hearing loss, unspecified hearing loss type - Plan:  Ambulatory referral to Audiology  Mixed hyperlipidemia - Plan: Comprehensive metabolic panel, Lipid panel  Screening for prostate cancer - Plan: PSA   Well 67 y.o. male. Counseled on diet and exercise. Other orders as above. Follow up in 6 mo.  The patient voiced understanding and agreement to the plan.  [name], DO 01/31/23 1:30 PM

## 2023-02-01 ENCOUNTER — Other Ambulatory Visit: Payer: Self-pay | Admitting: Family Medicine

## 2023-02-01 DIAGNOSIS — R972 Elevated prostate specific antigen [PSA]: Secondary | ICD-10-CM

## 2023-02-01 DIAGNOSIS — E782 Mixed hyperlipidemia: Secondary | ICD-10-CM

## 2023-02-07 ENCOUNTER — Encounter: Payer: Self-pay | Admitting: Family Medicine

## 2023-02-08 ENCOUNTER — Encounter: Payer: Self-pay | Admitting: Family Medicine

## 2023-02-08 ENCOUNTER — Ambulatory Visit: Payer: BC Managed Care – PPO | Admitting: Family Medicine

## 2023-02-08 VITALS — BP 110/70 | HR 85 | Temp 98.6°F | Ht 72.0 in | Wt 264.0 lb

## 2023-02-08 DIAGNOSIS — R3589 Other polyuria: Secondary | ICD-10-CM

## 2023-02-08 LAB — POC URINALSYSI DIPSTICK (AUTOMATED)
Bilirubin, UA: NEGATIVE
Blood, UA: NEGATIVE
Glucose, UA: POSITIVE — AB
Ketones, UA: NEGATIVE
Leukocytes, UA: NEGATIVE
Nitrite, UA: NEGATIVE
Protein, UA: NEGATIVE
Spec Grav, UA: 1.01 (ref 1.010–1.025)
Urobilinogen, UA: 0.2 E.U./dL
pH, UA: 5 (ref 5.0–8.0)

## 2023-02-08 NOTE — Progress Notes (Signed)
Chief Complaint  Patient presents with   Urinary Frequency    For one week Has improved in the past 24 hours.  Patient thinks was stress related since wife retired yesterday and he was anxious about the process for her.     Subjective: Patient is a 67 y.o. male here for urinary freq.  1 week ago, started urinating every 90 min, increased from 2.5-3 hrs prior to this. Wife retired yesterday, this was causing him some stress. He slept great last night and noticed his urination is back to his baseline. No dysuria, dc, bleeding, constipation.   Past Medical History:  Diagnosis Date   Cataract    not a surgical candiate at this time (09/19/2020)   Diabetes mellitus    on meds   Hyperlipidemia    on meds   Hypertension    on meds   Sleep apnea    uses CPAP    Objective: BP 110/70 (BP Location: Left Arm, Patient Position: Sitting, Cuff Size: Large)   Pulse 85   Temp 98.6 F (37 C) (Oral)   Ht 6' (1.829 m)   Wt 264 lb (119.7 kg)   SpO2 97%   BMI 35.80 kg/m  General: Awake, appears stated age Heart: RRR, no LE edema Lungs: CTAB, no rales, wheezes or rhonchi. No accessory muscle use Abd: BS+, S, NT, ND Psych: Age appropriate judgment and insight, normal affect and mood  Assessment and Plan: Polyuria  Psychogenic? UA neg today. Will monitor for recurrence.  The patient voiced understanding and agreement to the plan.  Jilda Roche [place], DO 02/08/23  11:31 AM

## 2023-02-08 NOTE — Patient Instructions (Signed)
Glad to hear things are better.   Let us know if you need anything.

## 2023-02-08 NOTE — Addendum Note (Signed)
Addended by: Scharlene Gloss B on: 02/08/2023 11:39 AM   Modules accepted: Orders

## 2023-02-09 NOTE — Addendum Note (Signed)
Addended by: Shacora Zynda P on: 02/09/2023 05:11 PM   Modules accepted: Level of Service  

## 2023-02-11 ENCOUNTER — Encounter: Payer: BC Managed Care – PPO | Admitting: Family Medicine

## 2023-02-14 DIAGNOSIS — E119 Type 2 diabetes mellitus without complications: Secondary | ICD-10-CM | POA: Diagnosis not present

## 2023-02-14 DIAGNOSIS — Z794 Long term (current) use of insulin: Secondary | ICD-10-CM | POA: Diagnosis not present

## 2023-02-14 DIAGNOSIS — I152 Hypertension secondary to endocrine disorders: Secondary | ICD-10-CM | POA: Diagnosis not present

## 2023-02-14 DIAGNOSIS — E782 Mixed hyperlipidemia: Secondary | ICD-10-CM | POA: Diagnosis not present

## 2023-02-15 ENCOUNTER — Ambulatory Visit: Payer: BC Managed Care – PPO | Admitting: Audiologist

## 2023-03-15 ENCOUNTER — Encounter: Payer: Self-pay | Admitting: Family Medicine

## 2023-03-15 ENCOUNTER — Other Ambulatory Visit (INDEPENDENT_AMBULATORY_CARE_PROVIDER_SITE_OTHER): Payer: Medicare Other

## 2023-03-15 DIAGNOSIS — R972 Elevated prostate specific antigen [PSA]: Secondary | ICD-10-CM

## 2023-03-15 DIAGNOSIS — E782 Mixed hyperlipidemia: Secondary | ICD-10-CM | POA: Diagnosis not present

## 2023-03-15 LAB — LIPID PANEL
Cholesterol: 193 mg/dL (ref 0–200)
HDL: 39.8 mg/dL (ref >39.00)
NonHDL: 152.75
Total CHOL/HDL Ratio: 5
Triglycerides: 326 mg/dL — ABNORMAL HIGH (ref 0.0–149.0)
VLDL: 65.2 mg/dL — ABNORMAL HIGH (ref 0.0–40.0)

## 2023-03-15 LAB — LDL CHOLESTEROL, DIRECT: Direct LDL: 121 mg/dL

## 2023-03-15 LAB — PSA: PSA: 0.92 ng/mL (ref 0.10–4.00)

## 2023-03-15 IMAGING — US US AORTA
1 series · 13 of 13 positions shown · non-contrast
Comparison: None.

CLINICAL DATA: Screening

EXAM:
ULTRASOUND OF ABDOMINAL AORTA
TECHNIQUE: Ultrasound examination of the abdominal aorta and proximal common
iliac arteries was performed to evaluate for aneurysm. Additional
color and Doppler images of the distal aorta were obtained to
document patency.

[Series 1: us aorta · 13 of 13 slices shown]
[im 1/13]
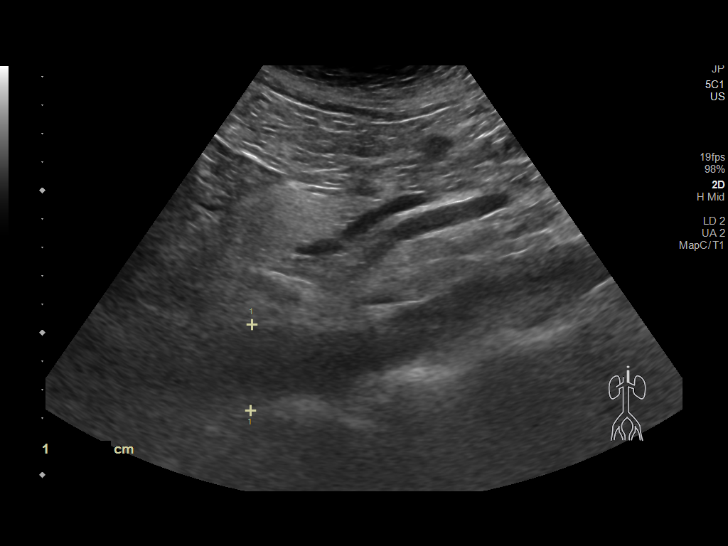
[im 2/13]
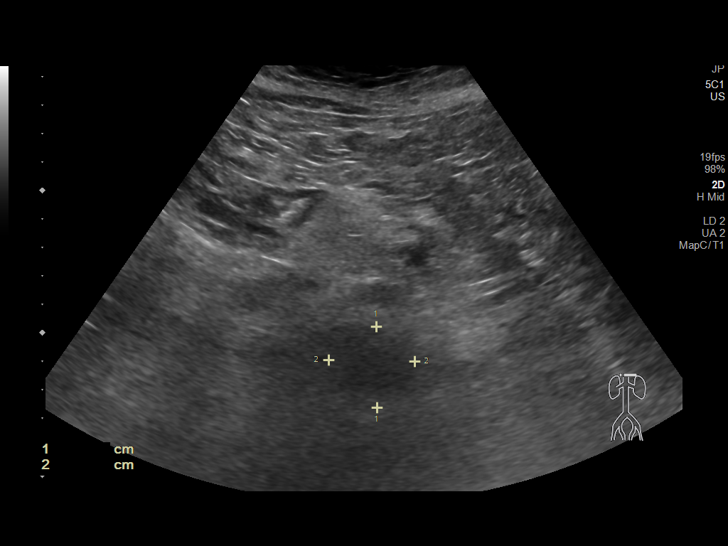
[im 3/13]
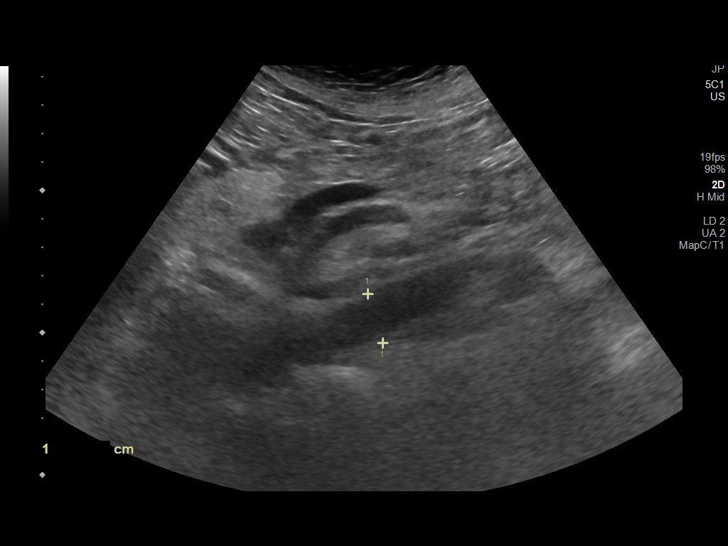
[im 4/13]
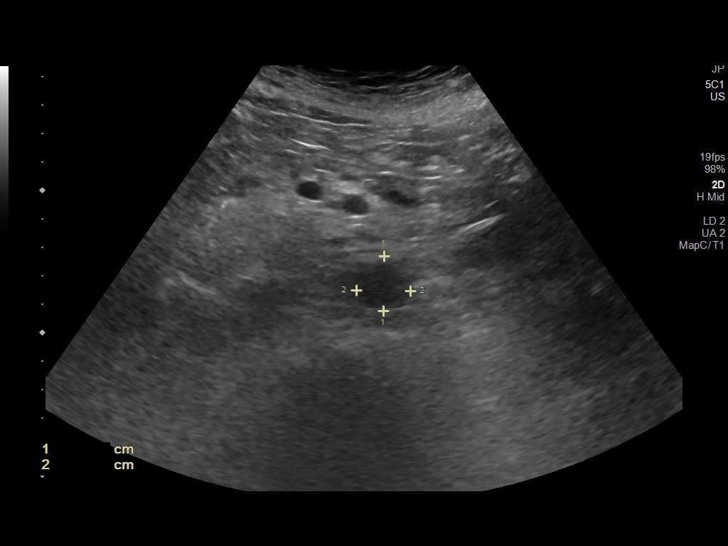
[im 5/13]
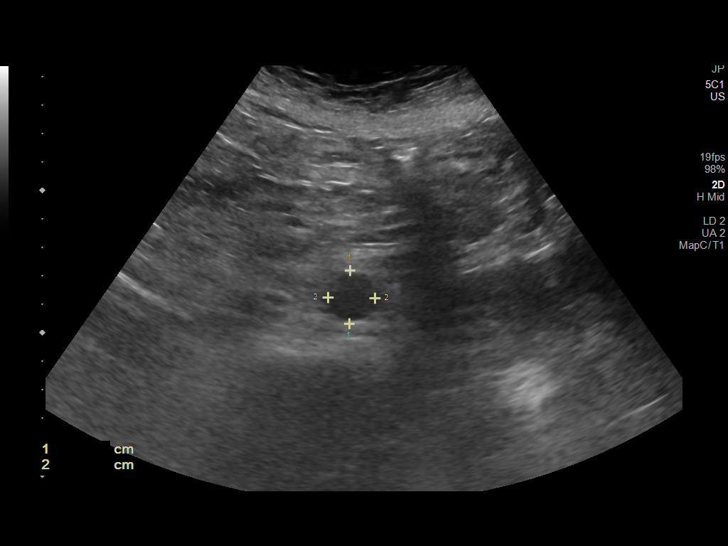
[im 6/13]
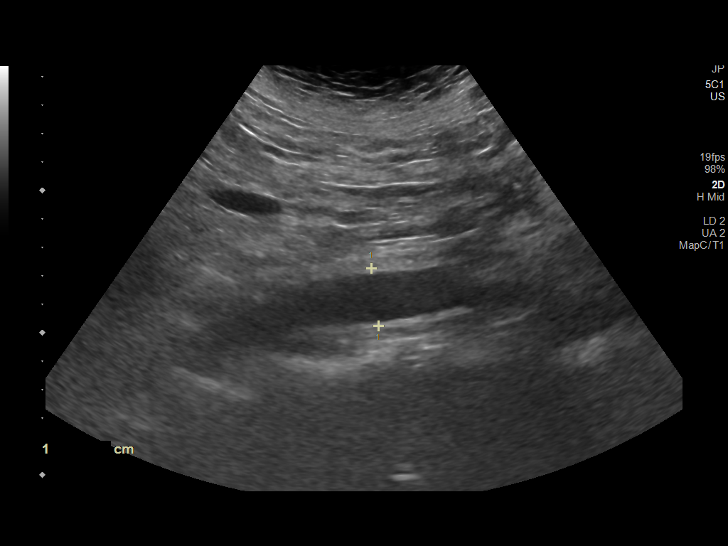
[im 7/13]
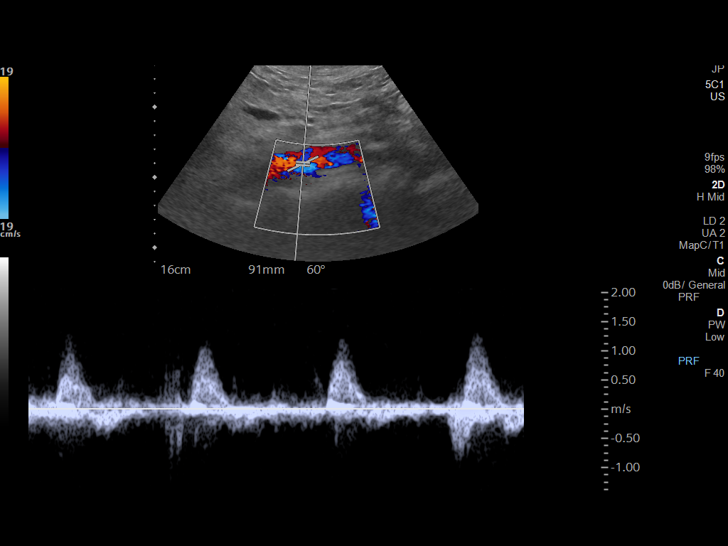
[im 8/13]
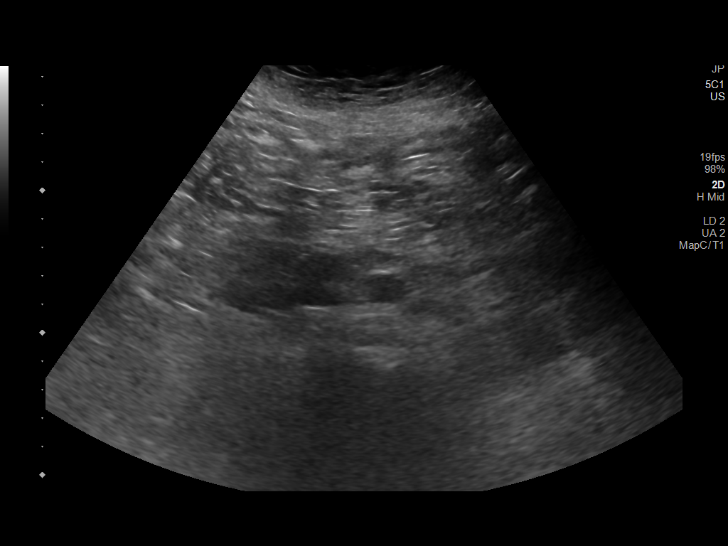
[im 9/13]
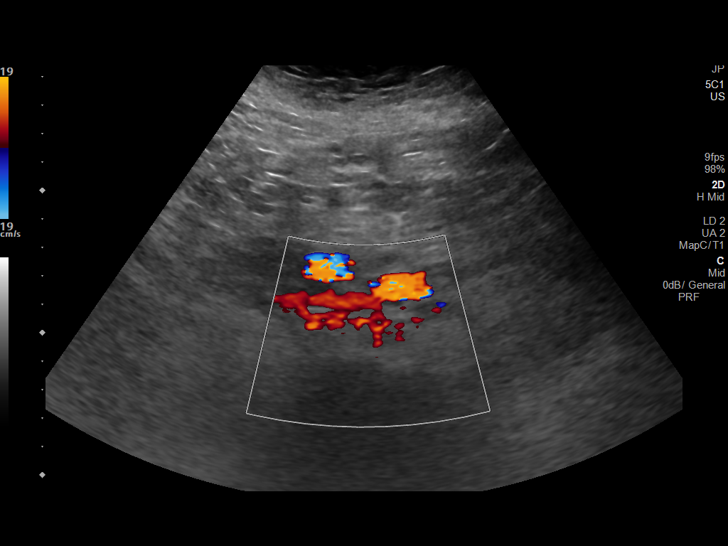
[im 10/13]
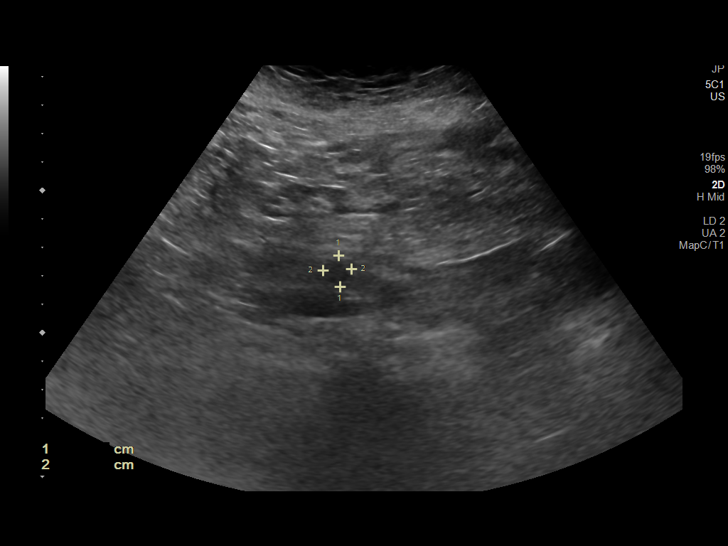
[im 11/13]
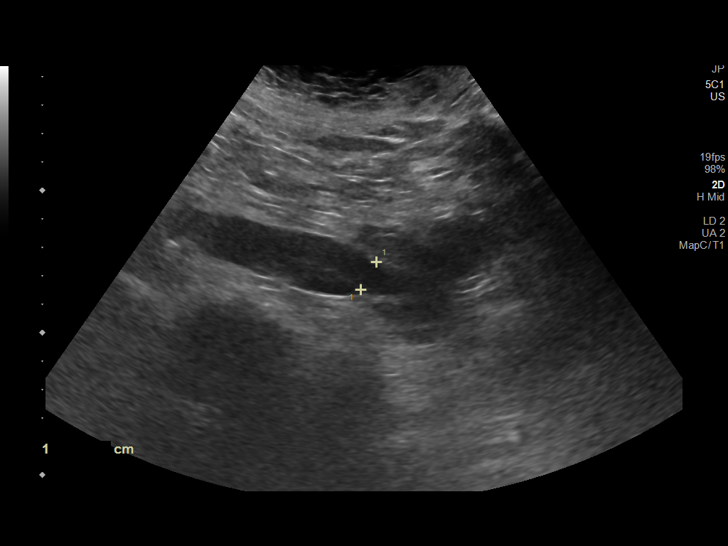
[im 12/13]
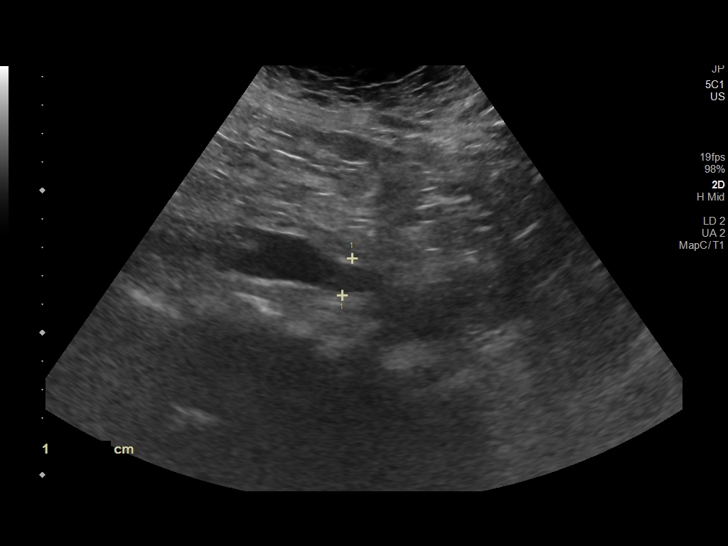
[im 13/13]
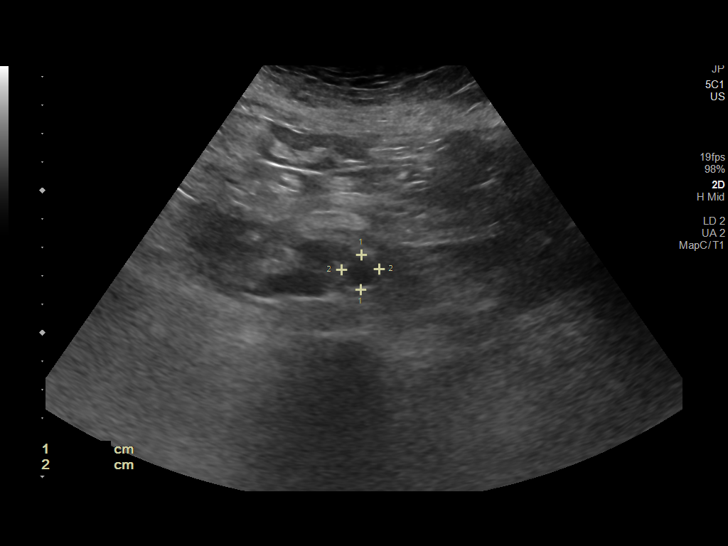

[13 of 13 positions shown; findings below may reference images not displayed]

FINDINGS: Abdominal aortic measurements as follows:

Proximal:  2.9 x 3 cm

Mid:  1.9 x 1.9 cm

Distal:  2 x 1.9 cm
Patent: Yes, peak systolic velocity is 140 cm/s

Right common iliac artery: 1.1 x 1 cm

Left common iliac artery: 1.2 x 1.3 cm
IMPRESSION: Negative for abdominal aortic aneurysm.

## 2023-03-16 ENCOUNTER — Other Ambulatory Visit: Payer: Self-pay | Admitting: Family Medicine

## 2023-03-16 DIAGNOSIS — R972 Elevated prostate specific antigen [PSA]: Secondary | ICD-10-CM

## 2023-03-16 DIAGNOSIS — E782 Mixed hyperlipidemia: Secondary | ICD-10-CM

## 2023-03-16 MED ORDER — ICOSAPENT ETHYL 1 G PO CAPS: 2.0000 g | ORAL_CAPSULE | Freq: Two times a day (BID) | ORAL | 5 refills | Status: DC

## 2023-03-30 ENCOUNTER — Other Ambulatory Visit: Payer: Self-pay | Admitting: Family Medicine

## 2023-03-30 MED ORDER — EZETIMIBE 10 MG PO TABS: 10.0000 mg | ORAL_TABLET | Freq: Every day | ORAL | 3 refills | Status: DC

## 2023-04-08 ENCOUNTER — Other Ambulatory Visit: Payer: Self-pay | Admitting: Family Medicine

## 2023-04-08 DIAGNOSIS — F411 Generalized anxiety disorder: Secondary | ICD-10-CM

## 2023-04-08 MED ORDER — CITALOPRAM HYDROBROMIDE 20 MG PO TABS
20.0000 mg | ORAL_TABLET | Freq: Every day | ORAL | 3 refills | Status: DC
Start: 2023-04-08 — End: 2024-06-28

## 2023-04-22 ENCOUNTER — Other Ambulatory Visit: Payer: Self-pay | Admitting: Family Medicine

## 2023-05-02 ENCOUNTER — Other Ambulatory Visit: Payer: Medicare Other

## 2023-05-09 ENCOUNTER — Other Ambulatory Visit (INDEPENDENT_AMBULATORY_CARE_PROVIDER_SITE_OTHER): Payer: Medicare Other

## 2023-05-09 DIAGNOSIS — R972 Elevated prostate specific antigen [PSA]: Secondary | ICD-10-CM | POA: Diagnosis not present

## 2023-05-09 DIAGNOSIS — E782 Mixed hyperlipidemia: Secondary | ICD-10-CM

## 2023-05-09 LAB — LIPID PANEL
Cholesterol: 142 mg/dL (ref 0–200)
HDL: 39.4 mg/dL (ref >39.00)
LDL Cholesterol: 74 mg/dL (ref 0–99)
NonHDL: 102.7
Total CHOL/HDL Ratio: 4
Triglycerides: 142 mg/dL (ref 0.0–149.0)
VLDL: 28.4 mg/dL (ref 0.0–40.0)

## 2023-05-09 LAB — PSA: PSA: 1.08 ng/mL (ref 0.10–4.00)

## 2023-05-31 ENCOUNTER — Encounter: Payer: Self-pay | Admitting: Family Medicine

## 2023-06-17 ENCOUNTER — Encounter: Payer: Self-pay | Admitting: Family Medicine

## 2023-06-17 ENCOUNTER — Other Ambulatory Visit: Payer: Self-pay | Admitting: Family Medicine

## 2023-06-17 ENCOUNTER — Other Ambulatory Visit (INDEPENDENT_AMBULATORY_CARE_PROVIDER_SITE_OTHER): Payer: Medicare Other

## 2023-06-17 DIAGNOSIS — E782 Mixed hyperlipidemia: Secondary | ICD-10-CM | POA: Diagnosis not present

## 2023-06-17 LAB — LIPID PANEL
Cholesterol: 178 mg/dL (ref 0–200)
HDL: 39.1 mg/dL (ref >39.00)
LDL Cholesterol: 100 mg/dL — ABNORMAL HIGH (ref 0–99)
NonHDL: 139.36
Total CHOL/HDL Ratio: 5
Triglycerides: 195 mg/dL — ABNORMAL HIGH (ref 0.0–149.0)
VLDL: 39 mg/dL (ref 0.0–40.0)

## 2023-06-17 MED ORDER — EZETIMIBE 10 MG PO TABS: 10.0000 mg | ORAL_TABLET | Freq: Every day | ORAL | 3 refills | Status: DC

## 2023-06-17 NOTE — Telephone Encounter (Signed)
Called pt to get him scheduled and he said have appt with Endocrinology. Pt said he will share the labs from they visit.

## 2023-07-17 ENCOUNTER — Other Ambulatory Visit: Payer: Self-pay | Admitting: Family Medicine

## 2023-08-08 ENCOUNTER — Ambulatory Visit: Payer: Self-pay | Admitting: Family Medicine

## 2023-08-08 ENCOUNTER — Telehealth: Payer: Medicare Other

## 2023-08-08 ENCOUNTER — Encounter: Payer: Self-pay | Admitting: Family Medicine

## 2023-08-08 DIAGNOSIS — R600 Localized edema: Secondary | ICD-10-CM | POA: Diagnosis not present

## 2023-08-08 NOTE — Progress Notes (Signed)
Virtual Visit Consent   Michael Howell, you are scheduled for a virtual visit with a [facility] provider today. Just as with appointments in the office, your consent must be obtained to participate. Your consent will be active for this visit and any virtual visit you may have with one of our providers in the next 365 days. If you have a MyChart account, a copy of this consent can be sent to you electronically.  As this is a virtual visit, video technology does not allow for your provider to perform a traditional examination. This may limit your provider's ability to fully assess your condition. If your provider identifies any concerns that need to be evaluated in person or the need to arrange testing (such as labs, EKG, etc.), we will make arrangements to do so. Although advances in technology are sophisticated, we cannot ensure that it will always work on either your end or our end. If the connection with a video visit is poor, the visit may have to be switched to a telephone visit. With either a video or telephone visit, we are not always able to ensure that we have a secure connection.  By engaging in this virtual visit, you consent to the provision of healthcare and authorize for your insurance to be billed (if applicable) for the services provided during this visit. Depending on your insurance coverage, you may receive a charge related to this service.  I need to obtain your verbal consent now. Are you willing to proceed with your visit today? Michael Howell has provided verbal consent on 08/08/2023 for a virtual visit (video or telephone). Freddy Finner, NP  Date: 08/08/2023 9:25 AM  Virtual Visit via Video Note   I, Freddy Finner, connected with  Michael Howell  ([id number], Sep 19, 1955) on 08/08/23 at  9:30 AM EST by a video-enabled telemedicine application and verified that I am speaking with the correct person using two identifiers.  Location: Patient: Virtual Visit Location Patient:  Home Provider: Virtual Visit Location Provider: Home Office   I discussed the limitations of evaluation and management by telemedicine and the availability of in person appointments. The patient expressed understanding and agreed to proceed.    History of Present Illness: Michael Howell is a 67 y.o. who identifies as a male who was assigned male at birth, and is being seen today for neck swelling  Saturday noted to have soreness on right side of neck that turned into swelling around the under side of jaw and and extends to chin.  No pain with swallowing, but some discomfort, eating makes it feel like it is worse. No drooling, nausea or vomiting. No recent URI symptoms Remote history of smoking quit 20 years ago   Problems:  Patient Active Problem List   Diagnosis Date Noted   Mixed hyperlipidemia 02/08/2022   Trigger ring finger of left hand 07/10/2020   Essential hypertension 07/10/2020   OSA on CPAP 07/10/2020    Allergies:  Allergies  Allergen Reactions   Poison Ivy Extract Rash   Medications:  Current Outpatient Medications:    aspirin 81 MG chewable tablet, Chew 1 tablet by mouth daily., Disp: , Rfl:    citalopram (CELEXA) 20 MG tablet, Take 1 tablet (20 mg total) by mouth daily., Disp: 30 tablet, Rfl: 3   ezetimibe (ZETIA) 10 MG tablet, Take 1 tablet (10 mg total) by mouth daily., Disp: 90 tablet, Rfl: 3   fenofibrate (TRICOR) 145 MG tablet, TAKE 1 TABLET(145 MG) BY MOUTH DAILY, Disp:  90 tablet, Rfl: 0   glucose blood (ONETOUCH VERIO) test strip, Use 1 times daily, Disp: , Rfl:    ibuprofen (ADVIL) 600 MG tablet, daily as needed., Disp: , Rfl:    lisinopril-hydrochlorothiazide (ZESTORETIC) 10-12.5 MG tablet, lisinopril 10 mg-hydrochlorothiazide 12.5 mg tablet, Disp: , Rfl:    OneTouch Delica Lancets 33G MISC, Use 1 time daily, Disp: , Rfl:    OZEMPIC, 0.25 OR 0.5 MG/DOSE, 2 MG/3ML SOPN, SMARTSIG:0.5 Milligram(s) Topical Once a Week, Disp: , Rfl:    pravastatin (PRAVACHOL)  40 MG tablet, Take 1 tablet (40 mg total) by mouth at bedtime., Disp: 90 tablet, Rfl: 3   SYNJARDY XR 12.12-998 MG TB24, Take 2 tablets by mouth daily., Disp: , Rfl:    VITAMIN D PO, Take 1 tablet by mouth daily., Disp: , Rfl:   Observations/Objective: Patient is well-developed, well-nourished in no acute distress.  Resting comfortably  at home.  Head is normocephalic, atraumatic.  No labored breathing.  Speech is clear and coherent with logical content.  Patient is alert and oriented at baseline.   Noted swelling and redness about the under jaw and chin  Assessment and Plan:  1. Submandibular gland swelling (Primary)  Large area of swelling, that is worsening as time goes, reports discomfort with swallowing, like it is getting worse when eating.  No breathing issues.  In person ASAP recommended  Patient acknowledged agreement and understanding of the plan.      Follow Up Instructions: I discussed the assessment and treatment plan with the patient. The patient was provided an opportunity to ask questions and all were answered. The patient agreed with the plan and demonstrated an understanding of the instructions.  A copy of instructions were sent to the patient via MyChart unless otherwise noted below.    The patient was advised to call back or seek an in-person evaluation if the symptoms worsen or if the condition fails to improve as anticipated.    Freddy Finner, NP

## 2023-08-08 NOTE — Patient Instructions (Signed)
  Michael Howell, thank you for joining Freddy Finner, NP for today's virtual visit.  While this provider is not your primary care provider (PCP), if your PCP is located in our provider database this encounter information will be shared with them immediately following your visit.   A [facility] MyChart account gives you access to today's visit and all your visits, tests, and labs performed at [facility] " click here if you don't have a [facility] MyChart account or go to [email or web address]  Consent: (Patient) Michael Howell provided verbal consent for this virtual visit at the beginning of the encounter.  Current Medications:  Current Outpatient Medications:    aspirin 81 MG chewable tablet, Chew 1 tablet by mouth daily., Disp: , Rfl:    citalopram (CELEXA) 20 MG tablet, Take 1 tablet (20 mg total) by mouth daily., Disp: 30 tablet, Rfl: 3   ezetimibe (ZETIA) 10 MG tablet, Take 1 tablet (10 mg total) by mouth daily., Disp: 90 tablet, Rfl: 3   fenofibrate (TRICOR) 145 MG tablet, TAKE 1 TABLET(145 MG) BY MOUTH DAILY, Disp: 90 tablet, Rfl: 0   glucose blood (ONETOUCH VERIO) test strip, Use 1 times daily, Disp: , Rfl:    ibuprofen (ADVIL) 600 MG tablet, daily as needed., Disp: , Rfl:    lisinopril-hydrochlorothiazide (ZESTORETIC) 10-12.5 MG tablet, lisinopril 10 mg-hydrochlorothiazide 12.5 mg tablet, Disp: , Rfl:    OneTouch Delica Lancets 33G MISC, Use 1 time daily, Disp: , Rfl:    OZEMPIC, 0.25 OR 0.5 MG/DOSE, 2 MG/3ML SOPN, SMARTSIG:0.5 Milligram(s) Topical Once a Week, Disp: , Rfl:    pravastatin (PRAVACHOL) 40 MG tablet, Take 1 tablet (40 mg total) by mouth at bedtime., Disp: 90 tablet, Rfl: 3   SYNJARDY XR 12.12-998 MG TB24, Take 2 tablets by mouth daily., Disp: , Rfl:    VITAMIN D PO, Take 1 tablet by mouth daily., Disp: , Rfl:    Medications ordered in this encounter:  No orders of the defined types were placed in this encounter.    *If you need refills on other  medications prior to your next appointment, please contact your pharmacy*  Follow-Up: Call back or seek an in-person evaluation if the symptoms worsen or if the condition fails to improve as anticipated.  [facility] [phone]    If you have been instructed to have an in-person evaluation today at a local Urgent Care facility, please use the link below. It will take you to a list of all of our available [facility], including address, phone number and hours of operation. Please do not delay care.  [facility]  If you or a family member do not have a primary care provider, use the link below to schedule a visit and establish care. When you choose a [facility] primary care physician or advanced practice provider, you gain a long-term partner in health. Find a Primary Care Provider  Learn more about [facility]'s in-office and virtual care options: [facility] - Get Care Now

## 2023-08-08 NOTE — Telephone Encounter (Signed)
Appt tomorrow.

## 2023-08-08 NOTE — Telephone Encounter (Signed)
Copied from CRM [id number]. Topic: Clinical - Red Word Triage >> Aug 08, 2023  9:38 AM Larwance Sachs wrote: Red Word that prompted transfer to Nurse Triage: Patient stated having telehealth appointment earlier due to having swollen lymph nodes, states started on the right side and is now in the middle of neck within the last 24 hours. Patient stated telehealth doctor advised to get scheduled and be assessed medically in person due to not feeling comfortable prescribing anything . Patient states severe swelling and difficulty swallowing    Chief Complaint: Swollen lymph node Symptoms: Swollen lymph node and discomfort with swallowing Frequency: Since Sunday Pertinent Negatives: Patient denies fever or difficulty breathing Disposition: [] ED /[] Urgent Care (no appt availability in office) / [x] Appointment(In office/virtual)/ []  [place] [facility]/ [] Home Care/ [] Refused Recommended Disposition /[]  Mobile Bus/ []  Follow-up with PCP  Additional Notes: Patient stated he has a swollen lymph node since yesterday morning. He reported discomfort with swallowing. He said that he did a video visit today and the provider advised him to be evaluated in person. In office visit has been scheduled for tomorrow.    Reason for Disposition  [1] Single large node AND [2] size > 1 inch (2.5 cm) AND [3] no fever  Answer Assessment - Initial Assessment Questions 1. LOCATION: "Where is the swollen node located?" "Is the matching node on the other side of the body also swollen?"      Right side and center area of neck  2. SIZE: "How big is the node?" (e.g., inches or centimeters; or compared to common objects such as pea, bean, marble, golf ball)      Golf ball  3. ONSET: "When did the swelling start?"      Yesterday morning   4. NECK NODES: "Is there a sore throat, runny nose or other symptoms of a cold?"      Discomfort when swallowing  5. FEVER: "Do you have a fever?" If Yes, ask: "What is it, how  was it measured, and when did it start?"      No fever  6. CAUSE: "What do you think is causing the swollen lymph nodes?"     Patient initially thought it may have been an allergic reaction. But then he stated he has no allergies and has not eaten anything unusual. Patient denies breathing difficulty.  7. OTHER SYMPTOMS: "Do you have any other symptoms?"     No  Protocols used: Lymph Nodes - Swollen-A-AH

## 2023-08-09 ENCOUNTER — Ambulatory Visit: Payer: Medicare Other | Admitting: Family Medicine

## 2023-08-09 ENCOUNTER — Encounter: Payer: Self-pay | Admitting: Family Medicine

## 2023-08-09 VITALS — BP 134/80 | HR 69 | Temp 98.5°F | Resp 18 | Ht 72.0 in | Wt 276.0 lb

## 2023-08-09 DIAGNOSIS — R599 Enlarged lymph nodes, unspecified: Secondary | ICD-10-CM | POA: Diagnosis not present

## 2023-08-09 LAB — CBC WITH DIFFERENTIAL/PLATELET
Basophils Absolute: 0.1 10*3/uL (ref 0.0–0.1)
Basophils Relative: 1 % (ref 0.0–3.0)
Eosinophils Absolute: 0.3 10*3/uL (ref 0.0–0.7)
Eosinophils Relative: 3.5 % (ref 0.0–5.0)
HCT: 44.6 % (ref 39.0–52.0)
Hemoglobin: 15 g/dL (ref 13.0–17.0)
Lymphocytes Relative: 20.7 % (ref 12.0–46.0)
Lymphs Abs: 1.8 10*3/uL (ref 0.7–4.0)
MCHC: 33.7 g/dL (ref 30.0–36.0)
MCV: 91 fL (ref 78.0–100.0)
Monocytes Absolute: 0.7 10*3/uL (ref 0.1–1.0)
Monocytes Relative: 7.4 % (ref 3.0–12.0)
Neutro Abs: 6 10*3/uL (ref 1.4–7.7)
Neutrophils Relative %: 67.4 % (ref 43.0–77.0)
Platelets: 362 10*3/uL (ref 150.0–400.0)
RBC: 4.9 Mil/uL (ref 4.22–5.81)
RDW: 13.5 % (ref 11.5–15.5)
WBC: 8.8 10*3/uL (ref 4.0–10.5)

## 2023-08-09 MED ORDER — PREDNISONE 20 MG PO TABS: 40.0000 mg | ORAL_TABLET | Freq: Every day | ORAL | 0 refills | Status: AC

## 2023-08-09 NOTE — Patient Instructions (Addendum)
 Give us  2-3 business days to get the results of your labs back.   If you continue to improve, do not take the prednisone .   Ice/cold pack over area for 10-15 min twice daily.  OK to take Tylenol  1000 mg (2 extra strength tabs) or 975 mg (3 regular strength tabs) every 6 hours as needed.  Let us  know if you need anything.

## 2023-08-09 NOTE — Progress Notes (Addendum)
 Chief Complaint  Patient presents with   Adenopathy    X Sunday 08/07/23. Some discomfort with swallowing. No fever, cough, congestion.     Michael Howell is a 67 y.o. male here for a skin complaint.  Duration: 2 days Location: under R jaw Pruritic? No Painful? No Drainage? No New soaps/lotions/topicals/detergents? No Trauma? No No close contacts.  Other associated symptoms: no fevers; BP and sugar were normal.  Therapies tried thus far: none  Past Medical History:  Diagnosis Date   Cataract    not a surgical candiate at this time (09/19/2020)   Diabetes mellitus    on meds   Hyperlipidemia    on meds   Hypertension    on meds   Sleep apnea    uses CPAP    BP 134/80 (BP Location: Left Arm, Cuff Size: Normal)   Pulse 69   Temp 98.5 F (36.9 C) (Oral)   Resp 18   Ht 6' (1.829 m)   Wt 276 lb (125.2 kg)   SpO2 95%   BMI 37.43 kg/m  Gen: awake, alert, appearing stated age Heart: RRR Lungs: CTAB. No accessory muscle use Skin: R submand gland enlarged and swollen, ttp. No drainage, erythema, fluctuance; I do not appreciate any cerv, clavicular or axillary adenopathy Mouth: MMM, no pharyngeal exudate or erythema Psych: Age appropriate judgment and insight  Swollen gland - Plan: CBC w/Diff, predniSONE  (DELTASONE ) 20 MG tablet  May get better on own. Ck above. 5 d pred burst 40 mg/d if worsening given holiday.  F/u prn. The patient voiced understanding and agreement to the plan.  Mabel Mt [place], DO 08/09/23 12:16 PM

## 2023-10-17 ENCOUNTER — Other Ambulatory Visit: Payer: Self-pay | Admitting: Family Medicine

## 2023-11-08 ENCOUNTER — Telehealth: Payer: Self-pay | Admitting: Family Medicine

## 2023-11-08 NOTE — Telephone Encounter (Signed)
 Copied from CRM [id number]. Topic: Medicare AWV >> Nov 08, 2023 11:21 AM Payton Doughty wrote: Reason for CRM: Called 11/08/2023 to sched AWV - MAILBOX FULL  Verlee Rossetti; Care Guide Ambulatory Clinical Support [place] [facility] Direct Dial: [phone]

## 2023-12-05 ENCOUNTER — Encounter (INDEPENDENT_AMBULATORY_CARE_PROVIDER_SITE_OTHER): Payer: Self-pay | Admitting: Family Medicine

## 2023-12-05 DIAGNOSIS — Z20828 Contact with and (suspected) exposure to other viral communicable diseases: Secondary | ICD-10-CM

## 2023-12-06 MED ORDER — OSELTAMIVIR PHOSPHATE 75 MG PO CAPS
75.0000 mg | ORAL_CAPSULE | Freq: Two times a day (BID) | ORAL | 0 refills | Status: AC
Start: 2023-12-06 — End: 2023-12-11

## 2023-12-06 NOTE — Telephone Encounter (Signed)

## 2023-12-28 LAB — BASIC METABOLIC PANEL WITH GFR
BUN: 15 (ref 4–21)
CO2: 24 — AB (ref 13–22)
Chloride: 98 — AB (ref 99–108)
Creatinine: 1 (ref 0.6–1.3)
Glucose: 138
Potassium: 4.2 meq/L (ref 3.5–5.1)
Sodium: 137 (ref 137–147)

## 2023-12-28 LAB — HEPATIC FUNCTION PANEL
ALT: 28 U/L (ref 10–40)
AST: 25 (ref 14–40)
Alkaline Phosphatase: 40 (ref 25–125)
Bilirubin, Total: 0.8

## 2023-12-28 LAB — HEMOGLOBIN A1C: Hemoglobin A1C: 6.8

## 2023-12-28 LAB — COMPREHENSIVE METABOLIC PANEL WITH GFR
Albumin: 5 (ref 3.5–5.0)
Calcium: 9.7 (ref 8.7–10.7)
eGFR: 81

## 2023-12-28 LAB — HM DIABETES FOOT EXAM

## 2023-12-28 LAB — PROTEIN / CREATININE RATIO, URINE
Albumin, U: <7
Creatinine, Urine: 41

## 2024-01-11 ENCOUNTER — Other Ambulatory Visit: Payer: Self-pay | Admitting: Family Medicine

## 2024-01-13 ENCOUNTER — Encounter: Payer: Self-pay | Admitting: Family Medicine

## 2024-01-13 ENCOUNTER — Ambulatory Visit (INDEPENDENT_AMBULATORY_CARE_PROVIDER_SITE_OTHER): Admitting: Family Medicine

## 2024-01-13 VITALS — BP 128/76 | HR 85 | Temp 98.0°F | Resp 16 | Ht 73.0 in | Wt 266.2 lb

## 2024-01-13 DIAGNOSIS — S86812A Strain of other muscle(s) and tendon(s) at lower leg level, left leg, initial encounter: Secondary | ICD-10-CM | POA: Diagnosis not present

## 2024-01-13 NOTE — Patient Instructions (Signed)
 Heat (pad or rice pillow in microwave) over affected area, 10-15 minutes twice daily.   Ice/cold pack over area for 10-15 min twice daily.  OK to take Tylenol  1000 mg (2 extra strength tabs) or 975 mg (3 regular strength tabs) every 6 hours as needed.  Let us  know if you need anything.  Stretching and range of motion exercises These exercises warm up your muscles and joints and improve the movement and flexibility of your lower leg. These exercises also help to relieve pain and stiffness.  Exercise A: Gastrocnemius stretch Sit with your left / right leg extended. Loop a belt or towel around the ball of your left / right foot. The ball of your foot is on the walking surface, right under your toes. Hold both ends of the belt or towel. Keep your left / right ankle and foot relaxed and keep your knee straight while you use the belt or towel to pull your foot and ankle toward you. Stop at the first point of resistance. Hold this position for 30 seconds. Repeat 2 times. Complete this exercise 3 times per week.  Exercise B: Ankle alphabet Sit with your left / right leg supported at the lower leg. Do not rest your foot on anything. Make sure your foot has room to move freely. Think of your left / right foot as a paintbrush, and move your foot to trace each letter of the alphabet in the air. Keep your hip and knee still while you trace. Trace every letter from A to Z. Repeat 2 times. Complete this exercise 3 times per week.  Strengthening exercises These exercises build strength and endurance in your lower leg. Endurance is the ability to use your muscles for a long time, even after they get tired.  Exercise C: Plantar flexors with band Sit with your left / right leg extended. Loop a rubber exercise band or tube around the ball of your left / right foot. The ball of your foot is on the walking surface, right under your toes. While holding both ends of the band or tube, slowly point your toes  downward, pushing them away from you. Hold this position for 3 seconds. Slowly return your foot to the starting position and repeat for a total of 10 repetitions. Repeat 2 times. Complete this exercise 3 times per week.  Exercise D: Plantar flexors, standing Stand with your feet shoulder-width apart. Place your hands on a wall or table to steady yourself as needed, but try not to use it very much for support. Rise up on your toes. If this exercise is too easy, try these options: Shift your weight toward your left / right leg until you feel challenged. If told by your health care provider, stand on your left / right foot only. Hold this position for 3 seconds. Repeat for a total of 10 repetitions. Repeat 2 times. Complete this exercise 3 times per week.  Exercise E: Plantar flexors, eccentric Stand on the balls of your feet on the edge of a step. The ball of your foot is on the walking surface, right under your toes. Place your hands on a wall or railing for balance as needed, but try not to lean on it for support. Rise up on your toes, using both legs to help. Slowly shift all of your weight to your left / right foot and lift your other foot off the step. Slowly lower your left / right heel so it drops below the level of the  step. You will feel a slight stretch in your left / right calf. Put your other foot back onto the step. Repeat 2 times. Complete this exercise 3 times per week. This information is not intended to replace advice given to you by your health care provider. Make sure you discuss any questions you have with your health care provider.

## 2024-01-13 NOTE — Progress Notes (Signed)
 Musculoskeletal Exam  Patient: Michael Howell DOB: 12/29/1955  DOS: 01/13/2024  SUBJECTIVE:  Chief Complaint:   Chief Complaint  Patient presents with   Muscle Pain    Left Calf Pain    Mathhew Howell is a 68 y.o.  male for evaluation and treatment of calf pain.   Onset:  5 days ago. Has been increasing mileage with his walking. Has not been stretching as much.  Location: L calf region Character:  aching, cramping Progression of issue:  has improved Associated symptoms: no SOB, swelling, bruising, redness.  Treatment: to date has been rest, ice, heat, and lidocaine patches.   No hx of DVT.  Neurovascular symptoms: no  Past Medical History:  Diagnosis Date   Cataract    not a surgical candiate at this time (09/19/2020)   Diabetes mellitus    on meds   Hyperlipidemia    on meds   Hypertension    on meds   Sleep apnea    uses CPAP    Objective: VITAL SIGNS: BP 128/76 (BP Location: Left Arm, Patient Position: Sitting)   Pulse 85   Temp 98 F (36.7 C) (Oral)   Resp 16   Ht 6\' 1"  (1.854 m)   Wt 266 lb 3.2 oz (120.7 kg)   SpO2 95%   BMI 35.12 kg/m  Constitutional: Well formed, well developed. No acute distress. Thorax & Lungs: No accessory muscle use Musculoskeletal: calf  Tenderness to palpation: Yes- medial entirety for L calf Deformity: no Ecchymosis: no Tests positive: none Tests negative: Homan's Neurologic: Normal sensory function.  Psychiatric: Normal mood. Age appropriate judgment and insight. Alert & oriented x 3.    Assessment:  Strain of calf muscle, left, initial encounter  Plan: Stretches/exercises, heat, ice, Tylenol . Extremely unlikely to be a clot. Send message if not improving and consider PT at that time.  F/u prn. The patient voiced understanding and agreement to the plan.   Shellie Dials [place], DO 01/13/24  1:23 PM

## 2024-01-16 ENCOUNTER — Encounter: Payer: Self-pay | Admitting: Family Medicine

## 2024-01-23 ENCOUNTER — Other Ambulatory Visit: Payer: Self-pay

## 2024-01-23 ENCOUNTER — Encounter: Payer: Self-pay | Admitting: Family Medicine

## 2024-01-23 DIAGNOSIS — M545 Low back pain, unspecified: Secondary | ICD-10-CM

## 2024-04-11 ENCOUNTER — Other Ambulatory Visit: Payer: Self-pay

## 2024-04-11 ENCOUNTER — Encounter: Payer: Self-pay | Admitting: Family Medicine

## 2024-04-11 MED ORDER — FENOFIBRATE 145 MG PO TABS: 145.0000 mg | ORAL_TABLET | Freq: Every day | ORAL | 0 refills | Status: DC

## 2024-04-25 ENCOUNTER — Encounter: Payer: Self-pay | Admitting: Family Medicine

## 2024-04-25 MED ORDER — EZETIMIBE 10 MG PO TABS: 10.0000 mg | ORAL_TABLET | Freq: Every day | ORAL | 1 refills | Status: AC

## 2024-06-23 ENCOUNTER — Encounter: Payer: Self-pay | Admitting: Family Medicine

## 2024-06-28 ENCOUNTER — Other Ambulatory Visit: Payer: Self-pay | Admitting: Family Medicine

## 2024-06-28 ENCOUNTER — Telehealth: Payer: Self-pay | Admitting: Family Medicine

## 2024-06-28 LAB — HEMOGLOBIN A1C: Hemoglobin A1C: 6.7

## 2024-06-28 MED ORDER — PREDNISONE 20 MG PO TABS: 40.0000 mg | ORAL_TABLET | Freq: Every day | ORAL | 0 refills | Status: AC

## 2024-06-28 NOTE — Telephone Encounter (Signed)
 Copied from CRM [id number]. Topic: Medicare AWV >> Jun 28, 2024 10:53 AM Nathanel DEL wrote: Called LVM 06/28/2024 to sched AWV. Please schedule in office or virtual visit.   Nathanel Paschal; Care Guide Ambulatory Clinical Support [place] [facility] Direct Dial: [phone]

## 2024-07-16 ENCOUNTER — Ambulatory Visit

## 2024-07-16 ENCOUNTER — Encounter: Payer: Self-pay | Admitting: Family Medicine

## 2024-07-16 ENCOUNTER — Telehealth: Payer: Self-pay | Admitting: *Deleted

## 2024-07-16 NOTE — Telephone Encounter (Signed)
 Pt scheduled for AWV today at 10:20.  No answer and voicemail full. Sent mychart message requesting to reschedule.

## 2024-07-17 ENCOUNTER — Ambulatory Visit

## 2024-07-17 VITALS — Ht 73.0 in | Wt 259.0 lb

## 2024-07-17 DIAGNOSIS — Z0000 Encounter for general adult medical examination without abnormal findings: Secondary | ICD-10-CM

## 2024-07-17 NOTE — Progress Notes (Signed)
 Chief Complaint  Patient presents with   Medicare Wellness     Subjective:   Michael Howell is a 68 y.o. male who presents for a Medicare Annual Wellness Visit.  Visit info / Clinical Intake: Medicare Wellness Visit Type:: Initial Annual Wellness Visit Persons participating in visit and providing information:: patient Medicare Wellness Visit Mode:: Telephone If telephone:: video declined Since this visit was completed virtually, some vitals may be partially provided or unavailable. Missing vitals are due to the limitations of the virtual format.: Unable to obtain vitals - no equipment If Telephone or Video please confirm:: I connected with patient using audio/video enable telemedicine. I verified patient identity with two identifiers, discussed telehealth limitations, and patient agreed to proceed. Patient Location:: home Provider Location:: office Interpreter Needed?: No Pre-visit prep was completed: yes AWV questionnaire completed by patient prior to visit?: no Living arrangements:: lives with spouse/significant other Patient's Overall Health Status Rating: very good Typical amount of pain: some (has low back issues. has seen PT, does stretches at home) Does pain affect daily life?: no Are you currently prescribed opioids?: no  Dietary Habits and Nutritional Risks How many meals a day?: 2 (does intermittent fasting) Eats fruit and vegetables daily?: yes Most meals are obtained by: preparing own meals In the last 2 weeks, have you had any of the following?: none Diabetic:: (!) yes Any non-healing wounds?: no How often do you check your BS?: 1 Would you like to be referred to a Nutritionist or for Diabetic Management? : no  Functional Status Activities of Daily Living (to include ambulation/medication): Independent Ambulation: Independent Medication Administration: Independent Home Management (perform basic housework or laundry): Independent Manage your own finances?:  yes Primary transportation is: driving Concerns about vision?: no *vision screening is required for WTM* (Up to date with Dr Francella / [facility]) Concerns about hearing?: no  Fall Screening Falls in the past year?: 0 Number of falls in past year: 0 Was there an injury with Fall?: 0 Fall Risk Category Calculator: 0 Patient Fall Risk Level: Low Fall Risk  Fall Risk Patient at Risk for Falls Due to: Orthopedic patient Fall risk Follow up: Falls evaluation completed  Home and Transportation Safety: All rugs have non-skid backing?: yes All stairs or steps have railings?: yes Grab bars in the bathtub or shower?: (!) no (walk in shower) Have non-skid surface in bathtub or shower?: yes (textured surface) Good home lighting?: yes Regular seat belt use?: yes Hospital stays in the last year:: no  Cognitive Assessment Difficulty concentrating, remembering, or making decisions? : no Will 6CIT or Mini Cog be Completed: yes What year is it?: 0 points What month is it?: 0 points Give patient an address phrase to remember (5 components): [address], [place]  About what time is it?: 0 points Count backwards from 20 to 1: 0 points Say the months of the year in reverse: 0 points Repeat the address phrase from earlier: 0 points 6 CIT Score: 0 points  Advance Directives (For Healthcare) Does Patient Have a Medical Advance Directive?: Yes Does patient want to make changes to medical advance directive?: No - Patient declined Type of Advance Directive: Healthcare Power of [place]; Living will Copy of Healthcare Power of Attorney in Chart?: Yes - validated most recent copy scanned in chart (See row information) Copy of Living Will in Chart?: Yes - validated most recent copy scanned in chart (See row information)  Reviewed/Updated  Reviewed/Updated: Reviewed All (Medical, Surgical, Family, Medications, Allergies, Care Teams,  Patient Goals)    Allergies (verified) Poison  ivy extract   Current Medications (verified) Outpatient Encounter Medications as of 07/17/2024  Medication Sig   aspirin 81 MG chewable tablet Chew 1 tablet by mouth daily.   ezetimibe  (ZETIA ) 10 MG tablet Take 1 tablet (10 mg total) by mouth daily.   fenofibrate  (TRICOR ) 145 MG tablet Take 1 tablet (145 mg total) by mouth daily.   glucose blood (ONETOUCH VERIO) test strip Use 1 times daily   lisinopril-hydrochlorothiazide (ZESTORETIC) 10-12.5 MG tablet lisinopril 10 mg-hydrochlorothiazide 12.5 mg tablet   OZEMPIC, 0.25 OR 0.5 MG/DOSE, 2 MG/3ML SOPN SMARTSIG:0.5 Milligram(s) Topical Once a Week   pravastatin  (PRAVACHOL ) 40 MG tablet Take 1 tablet (40 mg total) by mouth at bedtime.   SYNJARDY XR 12.12-998 MG TB24 Take 2 tablets by mouth daily.   VITAMIN D PO Take 1 tablet by mouth daily.   OneTouch Delica Lancets 33G MISC Use 1 time daily   [DISCONTINUED] ibuprofen (ADVIL) 600 MG tablet daily as needed.   No facility-administered encounter medications on file as of 07/17/2024.    History: Past Medical History:  Diagnosis Date   Cataract    not a surgical candiate at this time (09/19/2020)   Diabetes mellitus    on meds   Hyperlipidemia    on meds   Hypertension    on meds   Sleep apnea    uses CPAP   Past Surgical History:  Procedure Laterality Date   BACK SURGERY  1988   lumbar spine (L5) ruptured disc repaired   Family History  Problem Relation Age of Onset   Colon polyps Father 28   Diabetes Maternal Grandmother    Colon cancer Neg Hx    Esophageal cancer Neg Hx    Rectal cancer Neg Hx    Stomach cancer Neg Hx    Social History   Occupational History   Not on file  Tobacco Use   Smoking status: Former    Current packs/day: 0.00    Types: Cigarettes    Start date: 34    Quit date: 2007    Years since quitting: 18.9   Smokeless tobacco: Never  Vaping Use   Vaping status: Never Used  Substance and Sexual Activity   Alcohol use: No   Drug use: No    Sexual activity: Not on file   Tobacco Counseling Counseling given: Not Answered  SDOH Screenings   Food Insecurity: No Food Insecurity (08/08/2023)  Housing: Low Risk  (08/08/2023)  Transportation Needs: No Transportation Needs (08/08/2023)  Utilities: Low Risk (02/14/2023)   Received from [facility]  Depression (PHQ2-9): Low Risk  (07/17/2024)  Financial Resource Strain: Low Risk  (08/08/2023)  Physical Activity: Sufficiently Active (07/17/2024)  Social Connections: Moderately Integrated (07/17/2024)  Stress: No Stress Concern Present (07/17/2024)  Tobacco Use: Medium Risk (07/17/2024)   See flowsheets for full screening details  Depression Screen PHQ 2 & 9 Depression Scale- Over the past 2 weeks, how often have you been bothered by any of the following problems? Little interest or pleasure in doing things: 0 Feeling down, depressed, or hopeless (PHQ Adolescent also includes...irritable): 0 PHQ-2 Total Score: 0 Trouble falling or staying asleep, or sleeping too much: 1 (occasionally with back pain) Feeling tired or having little energy: 0 Poor appetite or overeating (PHQ Adolescent also includes...weight loss): 0 Feeling bad about yourself - or that you are a failure or have let yourself or your family down: 0 Trouble concentrating on things, such as  reading the newspaper or watching television [facility] Adolescent also includes...like school work): 0 Moving or speaking so slowly that other people could have noticed. Or the opposite - being so fidgety or restless that you have been moving around a lot more than usual: 0 Thoughts that you would be better off dead, or of hurting yourself in some way: 0 PHQ-9 Total Score: 1 If you checked off any problems, how difficult have these problems made it for you to do your work, take care of things at home, or get along with other people?: Not difficult at all  Depression Treatment Depression Interventions/Treatment : EYV7-0 Score <4 Follow-up  Not Indicated     Goals Addressed             This Visit's Progress    To add AI protocol for blood sugar control and weight loss with goal weight being 240lb               Objective:    Today's Vitals   07/17/24 1023  Weight: 259 lb (117.5 kg)  Height: 6' 1 (1.854 m)   Body mass index is 34.17 kg/m.  Hearing/Vision screen No results found. Immunizations and Health Maintenance Health Maintenance  Topic Date Due   Diabetic kidney evaluation - Urine ACR  Never done   COVID-19 Vaccine (4 - 2025-26 season) 04/09/2024   Influenza Vaccine  11/06/2024 (Originally 03/09/2024)   HEMOGLOBIN A1C  12/26/2024   Diabetic kidney evaluation - eGFR measurement  12/27/2024   FOOT EXAM  12/27/2024   OPHTHALMOLOGY EXAM  04/11/2025   Medicare Annual Wellness (AWV)  07/17/2025   Colonoscopy  10/03/2030   DTaP/Tdap/Td (3 - Td or Tdap) 02/09/2032   Pneumococcal Vaccine: 50+ Years  Completed   Hepatitis C Screening  Completed   Zoster Vaccines- Shingrix  Completed   Meningococcal B Vaccine  Aged Out        Assessment/Plan:  This is a routine wellness examination for [place].  Patient Care Team: Frann Mabel Mt, DO as PCP - General (Family Medicine) Tobie Grange, MD as Referring Physician (Endocrinology) P.A., [facility]  I have personally reviewed and noted the following in the patient's chart:   Medical and social history Use of alcohol, tobacco or illicit drugs  Current medications and supplements including opioid prescriptions. Functional ability and status Nutritional status Physical activity Advanced directives List of other physicians Hospitalizations, surgeries, and ER visits in previous 12 months Vitals Screenings to include cognitive, depression, and falls Referrals and appointments  No orders of the defined types were placed in this encounter.  In addition, I have reviewed and discussed with patient certain preventive  protocols, quality metrics, and best practice recommendations. A written personalized care plan for preventive services as well as general preventive health recommendations were provided to patient.   Lolita Libra, CMA   07/17/2024   Return in 1 year (on 07/17/2025).  After Visit Summary: (MyChart) Due to this being a telephonic visit, the after visit summary with patients personalized plan was offered to patient via MyChart   Nurse Notes: nothing significant to report

## 2024-07-17 NOTE — Patient Instructions (Addendum)
 Michael Howell,  Thank you for taking the time for your Medicare Wellness Visit. I appreciate your continued commitment to your health goals. Please review the care plan we discussed, and feel free to reach out if I can assist you further.  Please note that Annual Wellness Visits do not include a physical exam. Some assessments may be limited, especially if the visit was conducted virtually. If needed, we may recommend an in-person follow-up with your provider.  Goal: to use the AI protocol to lower blood sugars. Lose weight with goal weight being 240.  Ongoing Care Seeing your primary care provider every 3 to 6 months helps us  monitor your health and provide consistent, personalized care.   Dr Michael Howell: 07/23/24 10:30am Medicare AWV: 07/18/25 8:20am, telephone   Recommended Screenings: You will need to get the following vaccines at your local pharmacy: Flu (if you change your mind)  Health Maintenance  Topic Date Due   Yearly kidney health urinalysis for diabetes  Never done   COVID-19 Vaccine (4 - 2025-26 season) 04/09/2024   Flu Shot  11/06/2024*   Hemoglobin A1C  12/26/2024   Yearly kidney function blood test for diabetes  12/27/2024   Complete foot exam   12/27/2024   Eye exam for diabetics  04/11/2025   Medicare Annual Wellness Visit  07/17/2025   Colon Cancer Screening  10/03/2030   DTaP/Tdap/Td vaccine (3 - Td or Tdap) 02/09/2032   Pneumococcal Vaccine for age over 41  Completed   Hepatitis C Screening  Completed   Zoster (Shingles) Vaccine  Completed   Meningitis B Vaccine  Aged Out  *Topic was postponed. The date shown is not the original due date.       07/17/2024   10:25 AM  Advanced Directives  Does Patient Have a Medical Advance Directive? Yes  Type of [name];Living will  Does patient want to make changes to medical advance directive? No - Patient declined  Copy of Healthcare Power of Attorney in Chart? Yes - validated most  recent copy scanned in chart (See row information)    Vision: Annual vision screenings are recommended for early detection of glaucoma, cataracts, and diabetic retinopathy. These exams can also reveal signs of chronic conditions such as diabetes and high blood pressure.  Dental: Annual dental screenings help detect early signs of oral cancer, gum disease, and other conditions linked to overall health, including heart disease and diabetes.  Please see the attached documents for additional preventive care recommendations.

## 2024-07-23 ENCOUNTER — Encounter: Payer: Self-pay | Admitting: Family Medicine

## 2024-07-23 ENCOUNTER — Ambulatory Visit: Admitting: Family Medicine

## 2024-07-23 ENCOUNTER — Ambulatory Visit: Payer: Self-pay | Admitting: Family Medicine

## 2024-07-23 VITALS — BP 120/78 | HR 83 | Temp 98.5°F | Resp 16 | Ht 73.0 in | Wt 260.0 lb

## 2024-07-23 DIAGNOSIS — E782 Mixed hyperlipidemia: Secondary | ICD-10-CM

## 2024-07-23 LAB — COMPREHENSIVE METABOLIC PANEL WITH GFR
ALT: 23 U/L (ref 0–53)
AST: 20 U/L (ref 0–37)
Albumin: 4.7 g/dL (ref 3.5–5.2)
Alkaline Phosphatase: 43 U/L (ref 39–117)
BUN: 27 mg/dL — ABNORMAL HIGH (ref 6–23)
CO2: 31 meq/L (ref 19–32)
Calcium: 10 mg/dL (ref 8.4–10.5)
Chloride: 95 meq/L — ABNORMAL LOW (ref 96–112)
Creatinine, Ser: 1.09 mg/dL (ref 0.40–1.50)
GFR: 69.55 mL/min (ref >60.00)
Glucose, Bld: 145 mg/dL — ABNORMAL HIGH (ref 70–99)
Potassium: 4.9 meq/L (ref 3.5–5.1)
Sodium: 133 meq/L — ABNORMAL LOW (ref 135–145)
Total Bilirubin: 0.7 mg/dL (ref 0.2–1.2)
Total Protein: 6.8 g/dL (ref 6.0–8.3)

## 2024-07-23 LAB — CBC WITH DIFFERENTIAL/PLATELET
Basophils Absolute: 0 K/uL (ref 0.0–0.1)
Basophils Relative: 0.5 % (ref 0.0–3.0)
Eosinophils Absolute: 0.3 K/uL (ref 0.0–0.7)
Eosinophils Relative: 3.2 % (ref 0.0–5.0)
HCT: 43.1 % (ref 39.0–52.0)
Hemoglobin: 14.8 g/dL (ref 13.0–17.0)
Lymphocytes Relative: 23.3 % (ref 12.0–46.0)
Lymphs Abs: 1.9 K/uL (ref 0.7–4.0)
MCHC: 34.4 g/dL (ref 30.0–36.0)
MCV: 88.4 fl (ref 78.0–100.0)
Monocytes Absolute: 0.8 K/uL (ref 0.1–1.0)
Monocytes Relative: 9.8 % (ref 3.0–12.0)
Neutro Abs: 5.1 K/uL (ref 1.4–7.7)
Neutrophils Relative %: 63.2 % (ref 43.0–77.0)
Platelets: 371 K/uL (ref 150.0–400.0)
RBC: 4.88 Mil/uL (ref 4.22–5.81)
RDW: 13.7 % (ref 11.5–15.5)
WBC: 8.1 K/uL (ref 4.0–10.5)

## 2024-07-23 LAB — LIPID PANEL
Cholesterol: 132 mg/dL (ref 0–200)
HDL: 34.1 mg/dL — ABNORMAL LOW (ref >39.00)
LDL Cholesterol: 65 mg/dL (ref 0–99)
NonHDL: 97.82
Total CHOL/HDL Ratio: 4
Triglycerides: 162 mg/dL — ABNORMAL HIGH (ref 0.0–149.0)
VLDL: 32.4 mg/dL (ref 0.0–40.0)

## 2024-07-23 NOTE — Patient Instructions (Signed)
 Give us  2-3 business days to get the results of your labs back.   Keep the diet clean and stay active.  Let us  know if you need anything.

## 2024-07-23 NOTE — Progress Notes (Signed)
 Chief Complaint  Patient presents with   Follow-up    Follow Up    Subjective: Hyperlipidemia Patient presents for Hyperlipidemia follow up. Currently taking Pravachol  40 mg/d, Tricor  145 mg/d, Zetia  10 mg/d and compliance with treatment thus far has been good. He denies myalgias. He is usually adhering to a healthy diet. Exercise: walking, rehab for back No CP or SOB.  The patient is not known to have coexisting coronary artery disease.  Past Medical History:  Diagnosis Date   Cataract    not a surgical candiate at this time (09/19/2020)   Diabetes mellitus    on meds   Hyperlipidemia    on meds   Hypertension    on meds   Sleep apnea    uses CPAP    Objective: BP 120/78 (BP Location: Left Arm, Patient Position: Sitting)   Pulse 83   Temp 98.5 F (36.9 C) (Oral)   Resp 16   Ht 6' 1 (1.854 m)   Wt 260 lb (117.9 kg)   SpO2 98%   BMI 34.30 kg/m  General: Awake, appears stated age Heart: RRR, no LE edema, no bruits Lungs: CTAB, no rales, wheezes or rhonchi. No accessory muscle use Psych: Age appropriate judgment and insight, normal affect and mood  Assessment and Plan: Mixed hyperlipidemia - Plan: Lipid panel, Comprehensive metabolic panel with GFR, CBC w/Diff  Chronic, stable. Cont Pravachol  40 mg/d, Tricor  145 mg/d, Zetia  10 mg/d. Counseled on diet/exercise.  F/u in 6 mo. The patient voiced understanding and agreement to the plan.  Mabel Mt Murphy, DO 07/23/2024  10:47 AM

## 2025-07-18 ENCOUNTER — Ambulatory Visit
